# Patient Record
Sex: Female | Born: 1990 | Race: Black or African American | Hispanic: No | Smoking: Former smoker
Health system: Southern US, Community
[De-identification: ages and names within clinical notes are randomized; demographics above are authoritative.]

## PROBLEM LIST (undated history)

## (undated) DIAGNOSIS — A749 Chlamydial infection, unspecified: Secondary | ICD-10-CM

## (undated) HISTORY — DX: Chlamydial infection, unspecified: A74.9

---
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
## 2012-01-30 ENCOUNTER — Encounter: Payer: Self-pay | Admitting: Pediatric Cardiology

## 2012-02-15 ENCOUNTER — Emergency Department: Payer: Self-pay | Admitting: Emergency Medicine

## 2012-05-20 ENCOUNTER — Emergency Department: Payer: Self-pay | Admitting: Emergency Medicine

## 2012-05-20 LAB — URINALYSIS, COMPLETE
Bilirubin,UR: NEGATIVE
Blood: NEGATIVE
Glucose,UR: NEGATIVE mg/dL (ref 0–75)
Ph: 7 (ref 4.5–8.0)
Protein: NEGATIVE
RBC,UR: 4 /HPF (ref 0–5)
Squamous Epithelial: 15

## 2012-05-20 LAB — CBC
HCT: 31.5 % — ABNORMAL LOW (ref 35.0–47.0)
HGB: 10.4 g/dL — ABNORMAL LOW (ref 12.0–16.0)
MCH: 27.4 pg (ref 26.0–34.0)
MCV: 83 fL (ref 80–100)
Platelet: 305 10*3/uL (ref 150–440)
RBC: 3.81 10*6/uL (ref 3.80–5.20)
RDW: 18.8 % — ABNORMAL HIGH (ref 11.5–14.5)
WBC: 10.7 10*3/uL (ref 3.6–11.0)

## 2012-05-20 LAB — COMPREHENSIVE METABOLIC PANEL
Alkaline Phosphatase: 53 U/L (ref 50–136)
Calcium, Total: 8.9 mg/dL (ref 8.5–10.1)
Creatinine: 0.51 mg/dL — ABNORMAL LOW (ref 0.60–1.30)
EGFR (African American): >60
EGFR (Non-African Amer.): >60
Glucose: 84 mg/dL (ref 65–99)
Osmolality: 271 (ref 275–301)
Potassium: 4.2 mmol/L (ref 3.5–5.1)
SGOT(AST): 12 U/L — ABNORMAL LOW (ref 15–37)
SGPT (ALT): 13 U/L (ref 12–78)
Sodium: 137 mmol/L (ref 136–145)
Total Protein: 6.9 g/dL (ref 6.4–8.2)

## 2012-05-20 LAB — WET PREP, GENITAL

## 2012-05-20 LAB — LIPASE, BLOOD: Lipase: 95 U/L (ref 73–393)

## 2012-05-21 LAB — URINE CULTURE

## 2012-05-29 ENCOUNTER — Emergency Department: Payer: Self-pay | Admitting: Emergency Medicine

## 2012-07-16 ENCOUNTER — Emergency Department: Payer: Self-pay | Admitting: Emergency Medicine

## 2012-07-16 LAB — COMPREHENSIVE METABOLIC PANEL
Alkaline Phosphatase: 67 U/L (ref 50–136)
Anion Gap: 8 (ref 7–16)
Bilirubin,Total: 0.3 mg/dL (ref 0.2–1.0)
Calcium, Total: 8.2 mg/dL — ABNORMAL LOW (ref 8.5–10.1)
Co2: 23 mmol/L (ref 21–32)
Creatinine: 0.68 mg/dL (ref 0.60–1.30)
EGFR (African American): >60
Glucose: 102 mg/dL — ABNORMAL HIGH (ref 65–99)
Osmolality: 272 (ref 275–301)
SGOT(AST): 23 U/L (ref 15–37)
SGPT (ALT): 17 U/L (ref 12–78)
Sodium: 137 mmol/L (ref 136–145)

## 2012-07-16 LAB — URINALYSIS, COMPLETE
Bilirubin,UR: NEGATIVE
Glucose,UR: NEGATIVE mg/dL (ref 0–75)
Leukocyte Esterase: NEGATIVE
Ph: 6 (ref 4.5–8.0)
Protein: NEGATIVE
RBC,UR: NONE SEEN /HPF (ref 0–5)
Specific Gravity: 1.003 (ref 1.003–1.030)
Squamous Epithelial: 3

## 2012-07-16 LAB — CBC
HCT: 29.1 % — ABNORMAL LOW (ref 35.0–47.0)
HGB: 9.9 g/dL — ABNORMAL LOW (ref 12.0–16.0)
Platelet: 277 10*3/uL (ref 150–440)
RBC: 3.48 10*6/uL — ABNORMAL LOW (ref 3.80–5.20)
RDW: 13.9 % (ref 11.5–14.5)
WBC: 10.8 10*3/uL (ref 3.6–11.0)

## 2012-07-16 LAB — CK TOTAL AND CKMB (NOT AT ARMC): CK-MB: <0.5 ng/mL — ABNORMAL LOW (ref 0.5–3.6)

## 2012-07-16 LAB — TROPONIN I: Troponin-I: <0.02 ng/mL

## 2012-08-17 ENCOUNTER — Observation Stay: Payer: Self-pay | Admitting: Obstetrics and Gynecology

## 2012-08-17 ENCOUNTER — Emergency Department: Payer: Self-pay | Admitting: Emergency Medicine

## 2012-08-17 LAB — URINALYSIS, COMPLETE
Bilirubin,UR: NEGATIVE
Glucose,UR: NEGATIVE mg/dL (ref 0–75)
Ketone: NEGATIVE
Ph: 7 (ref 4.5–8.0)
Protein: NEGATIVE
RBC,UR: 1 /HPF (ref 0–5)
Specific Gravity: 1.014 (ref 1.003–1.030)
Squamous Epithelial: 11
WBC UR: 3 /HPF (ref 0–5)

## 2012-08-30 ENCOUNTER — Emergency Department: Payer: Self-pay | Admitting: Emergency Medicine

## 2012-08-31 LAB — URINALYSIS, COMPLETE
Bilirubin,UR: NEGATIVE
Blood: NEGATIVE
Hyaline Cast: 1
Nitrite: NEGATIVE
Protein: NEGATIVE
RBC,UR: NONE SEEN /HPF (ref 0–5)
Specific Gravity: 1.006 (ref 1.003–1.030)
Squamous Epithelial: 17
WBC UR: 4 /HPF (ref 0–5)

## 2012-12-11 ENCOUNTER — Inpatient Hospital Stay: Payer: Self-pay | Admitting: Obstetrics and Gynecology

## 2012-12-11 DIAGNOSIS — I10 Essential (primary) hypertension: Secondary | ICD-10-CM

## 2012-12-11 LAB — CBC WITH DIFFERENTIAL/PLATELET
Basophil #: 0.1 10*3/uL (ref 0.0–0.1)
Basophil %: 1 %
Eosinophil %: 0.8 %
HCT: 32.3 % — ABNORMAL LOW (ref 35.0–47.0)
HGB: 11.1 g/dL — ABNORMAL LOW (ref 12.0–16.0)
Lymphocyte #: 1.9 10*3/uL (ref 1.0–3.6)
Lymphocyte %: 23.6 %
MCHC: 34.3 g/dL (ref 32.0–36.0)
MCV: 85 fL (ref 80–100)
Monocyte %: 12.4 %
Neutrophil #: 5.1 10*3/uL (ref 1.4–6.5)
Neutrophil %: 62.2 %
Platelet: 210 10*3/uL (ref 150–440)
RBC: 3.82 10*6/uL (ref 3.80–5.20)
RDW: 14.1 % (ref 11.5–14.5)

## 2012-12-11 LAB — GC/CHLAMYDIA PROBE AMP

## 2012-12-11 LAB — PROTEIN / CREATININE RATIO, URINE
Creatinine, Urine: 22.9 mg/dL — ABNORMAL LOW (ref 30.0–125.0)
Protein, Random Urine: <5 mg/dL — ABNORMAL LOW (ref 0–12)

## 2013-12-04 ENCOUNTER — Emergency Department: Payer: Self-pay | Admitting: Internal Medicine

## 2014-07-10 NOTE — Op Note (Signed)
PATIENT NAME:  Shelly Hamilton, Shelly Hamilton MR#:  [id number] DATE OF BIRTH:  December 04, 1990  DATE OF PROCEDURE:  12/11/2012  PREOPERATIVE DIAGNOSES:  1. Term gestation.  2. Early labor.  3. Clinical diagnosis of cephalopelvic disproportion.   POSTOPERATIVE DIAGNOSES:  1. Term gestation.  2. Early labor.  3. Clinical diagnosis of cephalopelvic disproportion.   PROCEDURE: Primary low transverse cesarean section.   SURGEON: Ricky L. Logan BoresEvans, MD  ASSISTANT: Cydney OkAngela R. Lugiano, CNM  ANESTHESIA: Spinal.   FINDINGS: Small narrow pelvis, grossly normal uterus, tubes. Vigorous female infant weighing 10 pounds 3 ounces or 4620 grams. Excellent hemostasis and cosmesis.   ESTIMATED BLOOD LOSS: 650.   COMPLICATIONS: None.   SPECIMENS: None.   DRAINS: Foley.   PROCEDURE IN DETAIL: The patient was consented, after discussing with her the clinical diagnosis of CPD. 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 Also, recently diagnosed with Chlamydia, and has not, as of yet, had a test of cure. Recommended cesarean section. She stated understanding and desire to proceed. Consent was signed. The patient was taken to the operating room and placed in the seated position, where spinal was placed. She was placed in the supine position, prepped and draped in the usual sterile fashion. Foley was inserted, and after assuring adequate anesthesia, a #10 blade was used to create a Pfannenstiel incision, and primary low transverse cesarean section was carried out in the usual fashion with the exception of a small mid T of incision and use of vacuum extractor to deliver the vertex.   Chromic interlocking on the uterus, 0 Vicryl running on the fascia, cautery on subcutaneous and clips used on the skin.   The patient tolerated the procedure well. All instrument, needle and sponge counts were correct. I anticipate a routine  postoperative course. Ancef 1 gram  was given IV preoperatively.   ____________________________ Reatha Harpsicky L. Logan BoresEvans, MD rle:OSi D: 12/11/2012 12:34:45 ET T: 12/11/2012 13:20:14 ET JOB#: [id number]  cc: Ricky L. Logan BoresEvans, MD, <Dictator> Augustina MoodICK L Rebecah Dangerfield MD ELECTRONICALLY SIGNED 12/16/2012 10:23

## 2014-07-28 NOTE — H&P (Signed)
L&D Evaluation:  History:  HPI 24 y/o G1 @ 40/3 EDC 12/08/12 arrives with c/o regular contractions, denies leaking fluid or vaginal bleeding, baby is active. 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 B/P elevated, denies headache/visual disturbances, NV epigastric or RUQ pain + Sickle cell trait.   Presents with contractions   Patient's Medical History see above   Patient's Surgical History none   Medications Pre Natal Vitamins  Valtrex 1000mg  qd   Allergies NKDA   Social History none   Family History Non-Contributory   ROS:  ROS All systems were reviewed.  HEENT, CNS, GI, GU, Respiratory, CV, Renal and Musculoskeletal systems were found to be normal.   Exam:  Vital Signs stable  elevated B/P  149/97  131/3  131/78   Urine Protein to lab PC ratio   General no apparent distress   Mental Status clear   Chest clear   Heart normal sinus rhythm   Abdomen gravid, non-tender   Estimated Fetal Weight Average for gestational age   Fetal Position vtx   Fundal Height term   Back no CVAT   Edema 3+  pedal tibial   Reflexes 2+   Clonus negative   Pelvic Recent HSV lesion left labia visually healed (remains slightly tender per pt) -no other lesion visualized. 2cm cx posterior 50% vtx ballotable no bloody show BOWI   Mebranes Intact   FHT normal rate with no decels, baseline 120's 130's accels 130's 140's avg variability   Fetal Heart Rate 136   Ucx irregular, Q 2-5 mins 60 sec moderate   Skin dry   Lymph no lymphadenopathy   Impression:  Impression early labor, evaluation for PIH, Recent HSV II outbreak  Mild Pre eclampsia   Plan:  Plan EFM/NST, PIH panel   Comments Dr Logan BoresEvans consulted considering Primigravid status with recent HSV II outbreak-(uncertain primary or secondary) and mild Preeclampsia  (await labs) will recommend to proceed with primary csection. Explained to pt, questions answered, what to expect discussed, consent obtained.   Electronic Signatures: Albertina ParrLugiano, Pride Gonzales B (CNM)  (Signed 24-Sep-14 10:34)  Authored: L&D Evaluation   Last Updated: 24-Sep-14 10:34 by Albertina ParrLugiano, Iain Sawchuk B (CNM)

## 2014-11-28 ENCOUNTER — Emergency Department
Admission: EM | Admit: 2014-11-28 | Discharge: 2014-11-28 | Disposition: A | Discharge status: Home / Self Care | Payer: Self-pay | Attending: Emergency Medicine | Admitting: Emergency Medicine

## 2014-11-28 ENCOUNTER — Encounter: Payer: Self-pay | Admitting: Emergency Medicine

## 2014-11-28 DIAGNOSIS — M25562 Pain in left knee: Secondary | ICD-10-CM | POA: Insufficient documentation

## 2014-11-28 DIAGNOSIS — Z720 Tobacco use: Secondary | ICD-10-CM | POA: Insufficient documentation

## 2014-11-28 DIAGNOSIS — Z3202 Encounter for pregnancy test, result negative: Secondary | ICD-10-CM | POA: Insufficient documentation

## 2014-11-28 LAB — POCT PREGNANCY, URINE: Preg Test, Ur: NEGATIVE

## 2014-11-28 MED ORDER — NAPROXEN 500 MG PO TABS: 500.0000 mg | ORAL_TABLET | Freq: Two times a day (BID) | ORAL | Status: DC

## 2014-11-28 NOTE — ED Provider Notes (Signed)
[facility] Emergency Department Provider Note  ____________________________________________  Time seen: Approximately 11:37 AM  I have reviewed the triage vital signs and the nursing notes.   HISTORY  Chief Complaint Knee Pain  HPI Shelly Hamilton is a 24 y.o. female complaint left knee pain for "several days". 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 She has not taken any over-the-counter medication for it. She denies any trauma to her knee both recently and in the past. She has not seen any swelling during the times that she's had pain. She denies any health problems. She states her pain is 8 out of 10 at present.   History reviewed. No pertinent past medical history.  There are no active problems to display for this patient.   History reviewed. No pertinent past surgical history.  Current Outpatient Rx  Name  Route  Sig  Dispense  Refill  . naproxen (NAPROSYN) 500 MG tablet   Oral   Take 1 tablet (500 mg total) by mouth 2 (two) times daily with a meal.   30 tablet   0     Allergies Review of patient's allergies indicates no known allergies.  History reviewed. No pertinent family history.  Social History Social History  Substance Use Topics  . Smoking status: Current Every Day Smoker  . Smokeless tobacco: None  . Alcohol Use: Yes    Review of Systems Constitutional: No fever/chills ENT: No sore throat. Cardiovascular: Denies chest pain. Respiratory: Denies shortness of breath. Gastrointestinal: No abdominal pain.  No nausea, no vomiting.  Genitourinary: Negative for dysuria. No birth control used Musculoskeletal: Negative for back pain. Skin: Negative for rash. Neurological: Negative for headaches, focal weakness or numbness.  10-point ROS otherwise negative.  ____________________________________________   PHYSICAL EXAM:  VITAL SIGNS: ED Triage Vitals  Enc Vitals Group     BP 11/28/14  1050 122/73 mmHg     Pulse Rate 11/28/14 1050 94     Resp 11/28/14 1050 20     Temp 11/28/14 1050 98.5 F (36.9 C)     Temp Source 11/28/14 1050 Oral     SpO2 11/28/14 1050 98 %     Weight 11/28/14 1050 120 lb (54.432 kg)     Height 11/28/14 1050  (1.549 m)     Head Cir --      Peak Flow --      Pain Score 11/28/14 1051 8     Pain Loc --      Pain Edu? --      Excl. in GC? --     Constitutional: Alert and oriented. Well appearing and in no acute distress. Eyes: Conjunctivae are normal. PERRL. EOMI. Head: Atraumatic. Nose: No congestion/rhinnorhea. Neck: No stridor.  Cardiovascular: Normal rate, regular rhythm. Grossly normal heart sounds.  Good peripheral circulation. Respiratory: Normal respiratory effort.  No retractions. Lungs CTAB. Gastrointestinal: Soft and nontender. No distention. Musculoskeletal: Left knee with minimal tenderness anteriorly. There is no deformity or swelling or effusion noted. Range of motion is without crepitus. Ligaments are stable bilaterally. No lower extremity tenderness nor edema.  No joint effusions. Neurologic:  Normal speech and language. No gross focal neurologic deficits are appreciated. No gait instability. Skin:  Skin is warm, dry and intact. No rash noted. Left knee no erythema, abrasion, or ecchymosis. Psychiatric: Mood and affect are normal. Speech and behavior are normal.  ____________________________________________   LABS (all labs ordered are listed, but only abnormal  results are displayed)  Labs Reviewed  POC URINE PREG, ED    PROCEDURES  Procedure(s) performed: None  Critical Care performed: No  ____________________________________________   INITIAL IMPRESSION / ASSESSMENT AND PLAN / ED COURSE  Pertinent labs & imaging results that were available during my care of the patient were reviewed by me and considered in my medical decision making (see chart for details).  Patient was placed in knee immobilizer and also  given a prescription for naproxen to take twice a day. Patient is also to follow-up with Dr. Martha Clan if any continued knee problems. She is instructed to use ice and elevate while at home today. Also pregnancy test in the emergency room today was negative. ____________________________________________   FINAL CLINICAL IMPRESSION(S) / ED DIAGNOSES  Final diagnoses:  Knee pain, left anterior      Tommi Rumps, PA-C 11/28/14 1217  Minna Antis, MD 11/28/14 1513

## 2014-11-28 NOTE — ED Notes (Signed)
Pt called - she is unable to come at this moment to pick up paperwork, but will come later. Informed her i will keep the paperwork in flex and when she gets here to call and i will bring out her papers.

## 2014-11-28 NOTE — ED Notes (Signed)
Pt to ed with c/o left knee pain.  Denies known injury.  No swelling noted.  No bruising noted.

## 2014-11-28 NOTE — ED Notes (Signed)
Realized pt had the wrong paperwork and tried to catch her but she had left. Attempted to call pt but the vm states cant leave msg (mailbox not set up). Safety zone reported.

## 2015-05-23 ENCOUNTER — Encounter: Payer: Self-pay | Admitting: Emergency Medicine

## 2015-05-23 ENCOUNTER — Emergency Department
Admission: EM | Admit: 2015-05-23 | Discharge: 2015-05-23 | Discharge status: Against Medical Advice | Payer: Self-pay | Attending: Emergency Medicine | Admitting: Emergency Medicine

## 2015-05-23 DIAGNOSIS — Z3490 Encounter for supervision of normal pregnancy, unspecified, unspecified trimester: Secondary | ICD-10-CM

## 2015-05-23 DIAGNOSIS — R112 Nausea with vomiting, unspecified: Secondary | ICD-10-CM | POA: Insufficient documentation

## 2015-05-23 DIAGNOSIS — Z791 Long term (current) use of non-steroidal anti-inflammatories (NSAID): Secondary | ICD-10-CM | POA: Insufficient documentation

## 2015-05-23 DIAGNOSIS — Z331 Pregnant state, incidental: Secondary | ICD-10-CM | POA: Insufficient documentation

## 2015-05-23 DIAGNOSIS — Z87891 Personal history of nicotine dependence: Secondary | ICD-10-CM | POA: Insufficient documentation

## 2015-05-23 DIAGNOSIS — R1030 Lower abdominal pain, unspecified: Secondary | ICD-10-CM | POA: Insufficient documentation

## 2015-05-23 DIAGNOSIS — R102 Pelvic and perineal pain: Secondary | ICD-10-CM | POA: Insufficient documentation

## 2015-05-23 LAB — COMPREHENSIVE METABOLIC PANEL
ALBUMIN: 4.1 g/dL (ref 3.5–5.0)
ALT: 12 U/L — ABNORMAL LOW (ref 14–54)
ANION GAP: 6 (ref 5–15)
AST: 16 U/L (ref 15–41)
Alkaline Phosphatase: 38 U/L (ref 38–126)
BUN: 5 mg/dL — ABNORMAL LOW (ref 6–20)
CHLORIDE: 107 mmol/L (ref 101–111)
CO2: 24 mmol/L (ref 22–32)
Calcium: 8.8 mg/dL — ABNORMAL LOW (ref 8.9–10.3)
Creatinine, Ser: 0.65 mg/dL (ref 0.44–1.00)
GFR calc Af Amer: >60 mL/min (ref >60)
GFR calc non Af Amer: >60 mL/min (ref >60)
GLUCOSE: 89 mg/dL (ref 65–99)
POTASSIUM: 3.5 mmol/L (ref 3.5–5.1)
SODIUM: 137 mmol/L (ref 135–145)
Total Bilirubin: 0.7 mg/dL (ref 0.3–1.2)
Total Protein: 6.7 g/dL (ref 6.5–8.1)

## 2015-05-23 LAB — URINALYSIS COMPLETE WITH MICROSCOPIC (ARMC ONLY)
BACTERIA UA: NONE SEEN
BILIRUBIN URINE: NEGATIVE
GLUCOSE, UA: NEGATIVE mg/dL
HGB URINE DIPSTICK: NEGATIVE
Ketones, ur: NEGATIVE mg/dL
LEUKOCYTES UA: NEGATIVE
Nitrite: NEGATIVE
PH: 6 (ref 5.0–8.0)
Protein, ur: NEGATIVE mg/dL
Specific Gravity, Urine: 1.014 (ref 1.005–1.030)

## 2015-05-23 LAB — CBC
HEMATOCRIT: 31.8 % — AB (ref 35.0–47.0)
HEMOGLOBIN: 10.4 g/dL — AB (ref 12.0–16.0)
MCH: 26.6 pg (ref 26.0–34.0)
MCHC: 32.7 g/dL (ref 32.0–36.0)
MCV: 81.4 fL (ref 80.0–100.0)
Platelets: 353 10*3/uL (ref 150–440)
RBC: 3.9 MIL/uL (ref 3.80–5.20)
RDW: 16.6 % — ABNORMAL HIGH (ref 11.5–14.5)
WBC: 8.8 10*3/uL (ref 3.6–11.0)

## 2015-05-23 LAB — POCT PREGNANCY, URINE: PREG TEST UR: POSITIVE — AB

## 2015-05-23 LAB — HCG, QUANTITATIVE, PREGNANCY: HCG, BETA CHAIN, QUANT, S: 35102 m[IU]/mL — AB (ref <5)

## 2015-05-23 LAB — LIPASE, BLOOD: Lipase: 13 U/L (ref 11–51)

## 2015-05-23 NOTE — ED Notes (Signed)
Pt came to front desk upset about wait time and other people brought back before her.  Explained acuity and why some patients may have been brought back as well as there are different areas to the ED and different waiting rooms people may have been brought too.  Informed her unable to give her a wait time.

## 2015-05-23 NOTE — ED Notes (Signed)
Pt states that she has been having "severe abdominal pain for a couple of days". Pt states she is unable to keep food down and "barely can hold water down". Pt also c/o red streaks of blood in her vomit, denies coffee ground emesis at this time. Pt presents to triage in NAD. Skin warm, dry and intact, respirations even and unlabored at this time.

## 2015-05-23 NOTE — ED Provider Notes (Signed)
[facility] Emergency Department Provider Note    ____________________________________________  Time seen: ~1500  I have reviewed the triage vital signs and the nursing notes.   HISTORY  Chief Complaint Abdominal Pain and Hematemesis   History limited by: Not Limited   HPI Shelly Hamilton is a [age] y.o. female who presents to the emergency department today because of concerns for severe abdominal pain. She states it is located in the lower abdomen. It is on both sides. It is severe. It has been fairly constant over the past couple days. In addition the patient has had associated nausea and vomiting. She states that she has been able to keep much down. She states her last menstrual period was in January.Denies any vaginal bleeding.   History reviewed. No pertinent past medical history.  There are no active problems to display for this patient.   Past Surgical History  Procedure Laterality Date  . Cesarean section      Current Outpatient Rx  Name  Route  Sig  Dispense  Refill  . naproxen (NAPROSYN) 500 MG tablet   Oral   Take 1 tablet (500 mg total) by mouth 2 (two) times daily with a meal.   30 tablet   0     Allergies Review of patient's allergies indicates no known allergies.  History reviewed. No pertinent family history.  Social History Social History  Substance Use Topics  . Smoking status: Former Smoker    Quit date: 05/19/2015  . Smokeless tobacco: None  . Alcohol Use: Yes    Review of Systems  Constitutional: Negative for fever. Cardiovascular: Negative for chest pain. Respiratory: Negative for shortness of breath. Gastrointestinal: Positive for pelvic pain Neurological: Negative for headaches, focal weakness or numbness.   10-point ROS otherwise negative.  ____________________________________________   PHYSICAL EXAM:  VITAL SIGNS: ED Triage Vitals  Enc Vitals Group     BP 05/23/15 1210 118/76 mmHg     Pulse Rate  05/23/15 1210 58     Resp 05/23/15 1210 18     Temp 05/23/15 1210 98 F (36.7 C)     Temp Source 05/23/15 1210 Oral     SpO2 05/23/15 1210 100 %     Weight 05/23/15 1210 120 lb (54.432 kg)     Height 05/23/15 1210 5\' 3"  (1.6 m)     Head Cir --      Peak Flow --      Pain Score 05/23/15 1210 9   Constitutional: Alert and oriented. Well appearing and in no distress. Somewhat frustrated. Eyes: Conjunctivae are normal. PERRL. Normal extraocular movements. ENT   Head: Normocephalic and atraumatic.   Nose: No congestion/rhinnorhea.   Mouth/Throat: Mucous membranes are moist.   Neck: No stridor. Hematological/Lymphatic/Immunilogical: No cervical lymphadenopathy. Cardiovascular: Normal rate, regular rhythm.  No murmurs, rubs, or gallops. Respiratory: Normal respiratory effort without tachypnea nor retractions. Breath sounds are clear and equal bilaterally. No wheezes/rales/rhonchi. Gastrointestinal: Soft and minimally tender in the suprapubic region. No rebound. No guarding. Genitourinary: Deferred Musculoskeletal: Normal range of motion in all extremities. No joint effusions.  No lower extremity tenderness nor edema. Neurologic:  Normal speech and language. No gross focal neurologic deficits are appreciated.  Skin:  Skin is warm, dry and intact. No rash noted. Psychiatric: Mood and affect are normal. Speech and behavior are normal. Patient exhibits appropriate insight and judgment.  ____________________________________________    LABS (pertinent positives/negatives)  Labs Reviewed  COMPREHENSIVE METABOLIC PANEL - Abnormal; Notable for the following:  BUN 5 (*)    Calcium 8.8 (*)    ALT 12 (*)    All other components within normal limits  CBC - Abnormal; Notable for the following:    Hemoglobin 10.4 (*)    HCT 31.8 (*)    RDW 16.6 (*)    All other components within normal limits  URINALYSIS COMPLETEWITH MICROSCOPIC ([facility] ONLY) - Abnormal; Notable for the following:     Color, Urine YELLOW (*)    APPearance CLEAR (*)    Squamous Epithelial / LPF 0-5 (*)    All other components within normal limits  HCG, QUANTITATIVE, PREGNANCY - Abnormal; Notable for the following:    hCG, Beta Chain, Quant, S 35102 (*)    All other components within normal limits  POCT PREGNANCY, URINE - Abnormal; Notable for the following:    Preg Test, Ur POSITIVE (*)    All other components within normal limits  LIPASE, BLOOD  POC URINE PREG, ED     ____________________________________________   EKG  None  ____________________________________________    RADIOLOGY  None   ____________________________________________   PROCEDURES  Procedure(s) performed: None  Critical Care performed: No  ____________________________________________   INITIAL IMPRESSION / ASSESSMENT AND PLAN / ED COURSE  Pertinent labs & imaging results that were available during my care of the patient were reviewed by me and considered in my medical decision making (see chart for details).  Patient presented to the emergency department today because of concerns for pelvic pain nausea and vomiting. Patient's urine pregnancy was positive. Patient denies knowledge of pregnancy. By the time I was able to examine the patient she was quite frustrated with the wait times. She stated she had to leave to go to her job. I discussed with the patient my concern for possible ectopic pregnancy. I did discuss her desire to obtain an ultrasound. We did discuss the possibility of a ruptured ectopic leading to severe bleeding and possibly death. Patient verbalized understanding of these wrist. Patient did choose to leave the emergency department prior to ultrasound being performed. I did encourage patient to follow up with her OB/GYN doctors.   ____________________________________________   FINAL CLINICAL IMPRESSION(S) / ED DIAGNOSES  Final diagnoses:  Pregnancy     Phineas Semen, MD 05/23/15  [phone]

## 2015-05-23 NOTE — Discharge Instructions (Signed)
Please seek medical attention for any high fevers, chest pain, shortness of breath, change in behavior, persistent vomiting, bloody stool or any other new or concerning symptoms.   First Trimester of Pregnancy The first trimester of pregnancy is from week 1 until the end of week 12 (months 1 through 3). A week after a sperm fertilizes an egg, the egg will implant on the wall of the uterus. This embryo will begin to develop into a baby. Genes from you and your partner are forming the baby. The female genes determine whether the baby is a boy or a girl. At 6-8 weeks, the eyes and face are formed, and the heartbeat can be seen on ultrasound. At the end of 12 weeks, all the baby's organs are formed.  Now that you are pregnant, you will want to do everything you can to have a healthy baby. Two of the most important things are to get good prenatal care and to follow your health care provider's instructions. Prenatal care is all the medical care you receive before the baby's birth. This care will help prevent, find, and treat any problems during the pregnancy and childbirth. BODY CHANGES Your body goes through many changes during pregnancy. The changes vary from woman to woman.   You may gain or lose a couple of pounds at first.  You may feel sick to your stomach (nauseous) and throw up (vomit). If the vomiting is uncontrollable, call your health care provider.  You may tire easily.  You may develop headaches that can be relieved by medicines approved by your health care provider.  You may urinate more often. Painful urination may mean you have a bladder infection.  You may develop heartburn as a result of your pregnancy.  You may develop constipation because certain hormones are causing the muscles that push waste through your intestines to slow down.  You may develop hemorrhoids or swollen, bulging veins (varicose veins).  Your breasts may begin to grow larger and become tender. Your nipples may  stick out more, and the tissue that surrounds them (areola) may become darker.  Your gums may bleed and may be sensitive to brushing and flossing.  Dark spots or blotches (chloasma, mask of pregnancy) may develop on your face. This will likely fade after the baby is born.  Your menstrual periods will stop.  You may have a loss of appetite.  You may develop cravings for certain kinds of food.  You may have changes in your emotions from day to day, such as being excited to be pregnant or being concerned that something may go wrong with the pregnancy and baby.  You may have more vivid and strange dreams.  You may have changes in your hair. These can include thickening of your hair, rapid growth, and changes in texture. Some women also have hair loss during or after pregnancy, or hair that feels dry or thin. Your hair will most likely return to normal after your baby is born. WHAT TO EXPECT AT YOUR PRENATAL VISITS During a routine prenatal visit:  You will be weighed to make sure you and the baby are growing normally.  Your blood pressure will be taken.  Your abdomen will be measured to track your baby's growth.  The fetal heartbeat will be listened to starting around week 10 or 12 of your pregnancy.  Test results from any previous visits will be discussed. Your health care provider may ask you:  How you are feeling.  If you are feeling  the baby move.  If you have had any abnormal symptoms, such as leaking fluid, bleeding, severe headaches, or abdominal cramping.  If you are using any tobacco products, including cigarettes, chewing tobacco, and electronic cigarettes.  If you have any questions. Other tests that may be performed during your first trimester include:  Blood tests to find your blood type and to check for the presence of any previous infections. They will also be used to check for low iron levels (anemia) and Rh antibodies. Later in the pregnancy, blood tests for  diabetes will be done along with other tests if problems develop.  Urine tests to check for infections, diabetes, or protein in the urine.  An ultrasound to confirm the proper growth and development of the baby.  An amniocentesis to check for possible genetic problems.  Fetal screens for spina bifida and Down syndrome.  You may need other tests to make sure you and the baby are doing well.  HIV (human immunodeficiency virus) testing. Routine prenatal testing includes screening for HIV, unless you choose not to have this test. HOME CARE INSTRUCTIONS  Medicines  Follow your health care provider's instructions regarding medicine use. Specific medicines may be either safe or unsafe to take during pregnancy.  Take your prenatal vitamins as directed.  If you develop constipation, try taking a stool softener if your health care provider approves. Diet  Eat regular, well-balanced meals. Choose a variety of foods, such as meat or vegetable-based protein, fish, milk and low-fat dairy products, vegetables, fruits, and whole grain breads and cereals. Your health care provider will help you determine the amount of weight gain that is right for you.  Avoid raw meat and uncooked cheese. These carry germs that can cause birth defects in the baby.  Eating four or five small meals rather than three large meals a day may help relieve nausea and vomiting. If you start to feel nauseous, eating a few soda crackers can be helpful. Drinking liquids between meals instead of during meals also seems to help nausea and vomiting.  If you develop constipation, eat more high-fiber foods, such as fresh vegetables or fruit and whole grains. Drink enough fluids to keep your urine clear or pale yellow. Activity and Exercise  Exercise only as directed by your health care provider. Exercising will help you:  Control your weight.  Stay in shape.  Be prepared for labor and delivery.  Experiencing pain or cramping  in the lower abdomen or low back is a good sign that you should stop exercising. Check with your health care provider before continuing normal exercises.  Try to avoid standing for long periods of time. Move your legs often if you must stand in one place for a long time.  Avoid heavy lifting.  Wear low-heeled shoes, and practice good posture.  You may continue to have sex unless your health care provider directs you otherwise. Relief of Pain or Discomfort  Wear a good support bra for breast tenderness.   Take warm sitz baths to soothe any pain or discomfort caused by hemorrhoids. Use hemorrhoid cream if your health care provider approves.   Rest with your legs elevated if you have leg cramps or low back pain.  If you develop varicose veins in your legs, wear support hose. Elevate your feet for 15 minutes, 3-4 times a day. Limit salt in your diet. Prenatal Care  Schedule your prenatal visits by the twelfth week of pregnancy. They are usually scheduled monthly at first, then more  often in the last 2 months before delivery.  Write down your questions. Take them to your prenatal visits.  Keep all your prenatal visits as directed by your health care provider. Safety  Wear your seat belt at all times when driving.  Make a list of emergency phone numbers, including numbers for family, friends, the hospital, and police and fire departments. General Tips  Ask your health care provider for a referral to a local prenatal education class. Begin classes no later than at the beginning of month 6 of your pregnancy.  Ask for help if you have counseling or nutritional needs during pregnancy. Your health care provider can offer advice or refer you to specialists for help with various needs.  Do not use hot tubs, steam rooms, or saunas.  Do not douche or use tampons or scented sanitary pads.  Do not cross your legs for long periods of time.  Avoid cat litter boxes and soil used by cats.  These carry germs that can cause birth defects in the baby and possibly loss of the fetus by miscarriage or stillbirth.  Avoid all smoking, herbs, alcohol, and medicines not prescribed by your health care provider. Chemicals in these affect the formation and growth of the baby.  Do not use any tobacco products, including cigarettes, chewing tobacco, and electronic cigarettes. If you need help quitting, ask your health care provider. You may receive counseling support and other resources to help you quit.  Schedule a dentist appointment. At home, brush your teeth with a soft toothbrush and be gentle when you floss. SEEK MEDICAL CARE IF:   You have dizziness.  You have mild pelvic cramps, pelvic pressure, or nagging pain in the abdominal area.  You have persistent nausea, vomiting, or diarrhea.  You have a bad smelling vaginal discharge.  You have pain with urination.  You notice increased swelling in your face, hands, legs, or ankles. SEEK IMMEDIATE MEDICAL CARE IF:   You have a fever.  You are leaking fluid from your vagina.  You have spotting or bleeding from your vagina.  You have severe abdominal cramping or pain.  You have rapid weight gain or loss.  You vomit blood or material that looks like coffee grounds.  You are exposed to [place] measles and have never had them.  You are exposed to fifth disease or chickenpox.  You develop a severe headache.  You have shortness of breath.  You have any kind of trauma, such as from a fall or a car accident.   This information is not intended to replace advice given to you by your health care provider. Make sure you discuss any questions you have with your health care provider.   Document Released: 02/28/2001 Document Revised: 03/27/2014 Document Reviewed: 01/14/2013 Elsevier Interactive Patient Education [employer]2016 [employer] Inc.

## 2015-05-23 NOTE — ED Notes (Signed)
Patient states she has waited long hours in lobby and is unable to stay d/t needing to go to her second job. Advised patient of her lab results (patient was not aware she was pregnant). Patient advised by MD of risks for leaving. States she is unable to stay. Advised by this RN to seek medical attention if pain worsens, or any abnormal symptoms progress/occur. Patient agreed. [facility] and is aware she needs ultrasound. Patient given graham crackers, peanut butter, and lemon lime soda d/t feeling weak and not eating today before leaving.

## 2015-05-27 ENCOUNTER — Encounter: Payer: Self-pay | Admitting: *Deleted

## 2015-05-27 DIAGNOSIS — Y92009 Unspecified place in unspecified non-institutional (private) residence as the place of occurrence of the external cause: Secondary | ICD-10-CM | POA: Insufficient documentation

## 2015-05-27 DIAGNOSIS — O9A219 Injury, poisoning and certain other consequences of external causes complicating pregnancy, unspecified trimester: Secondary | ICD-10-CM | POA: Insufficient documentation

## 2015-05-27 DIAGNOSIS — F17200 Nicotine dependence, unspecified, uncomplicated: Secondary | ICD-10-CM | POA: Insufficient documentation

## 2015-05-27 DIAGNOSIS — Y998 Other external cause status: Secondary | ICD-10-CM | POA: Insufficient documentation

## 2015-05-27 DIAGNOSIS — Z3A00 Weeks of gestation of pregnancy not specified: Secondary | ICD-10-CM | POA: Insufficient documentation

## 2015-05-27 DIAGNOSIS — S29001A Unspecified injury of muscle and tendon of front wall of thorax, initial encounter: Secondary | ICD-10-CM | POA: Insufficient documentation

## 2015-05-27 DIAGNOSIS — O99330 Smoking (tobacco) complicating pregnancy, unspecified trimester: Secondary | ICD-10-CM | POA: Insufficient documentation

## 2015-05-27 DIAGNOSIS — S0993XA Unspecified injury of face, initial encounter: Secondary | ICD-10-CM | POA: Insufficient documentation

## 2015-05-27 DIAGNOSIS — S9032XA Contusion of left foot, initial encounter: Secondary | ICD-10-CM | POA: Insufficient documentation

## 2015-05-27 DIAGNOSIS — Z791 Long term (current) use of non-steroidal anti-inflammatories (NSAID): Secondary | ICD-10-CM | POA: Insufficient documentation

## 2015-05-27 DIAGNOSIS — Y9389 Activity, other specified: Secondary | ICD-10-CM | POA: Insufficient documentation
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
## 2015-05-28 ENCOUNTER — Emergency Department
Admission: EM | Admit: 2015-05-28 | Discharge: 2015-05-28 | Disposition: A | Discharge status: Home / Self Care | Payer: Self-pay | Attending: Emergency Medicine | Admitting: Emergency Medicine

## 2015-05-28 ENCOUNTER — Encounter: Payer: Self-pay | Admitting: Emergency Medicine

## 2015-05-28 ENCOUNTER — Emergency Department: Payer: Self-pay

## 2015-05-28 DIAGNOSIS — S9032XA Contusion of left foot, initial encounter: Secondary | ICD-10-CM

## 2015-05-28 DIAGNOSIS — T07XXXA Unspecified multiple injuries, initial encounter: Secondary | ICD-10-CM

## 2015-05-28 NOTE — ED Notes (Signed)
[facility] advocate at bedside at this time, pt given ginger ale to drink at this time, pt tolerating well, NAD noted.
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
## 2015-05-28 NOTE — ED Notes (Signed)
MD Forbach at bedside. 

## 2015-05-28 NOTE — ED Notes (Signed)
Pt given phone at this time to call a friend in order to stay there for the night. Pt stable, NAD noted.
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
## 2015-12-07 ENCOUNTER — Emergency Department: Payer: Self-pay

## 2015-12-07 ENCOUNTER — Emergency Department
Admission: EM | Admit: 2015-12-07 | Discharge: 2015-12-07 | Disposition: A | Discharge status: Home / Self Care | Payer: Self-pay | Attending: Emergency Medicine | Admitting: Emergency Medicine

## 2015-12-07 DIAGNOSIS — H5712 Ocular pain, left eye: Secondary | ICD-10-CM

## 2015-12-07 DIAGNOSIS — Y999 Unspecified external cause status: Secondary | ICD-10-CM | POA: Insufficient documentation

## 2015-12-07 DIAGNOSIS — W500XXA Accidental hit or strike by another person, initial encounter: Secondary | ICD-10-CM | POA: Insufficient documentation

## 2015-12-07 DIAGNOSIS — Y929 Unspecified place or not applicable: Secondary | ICD-10-CM | POA: Insufficient documentation

## 2015-12-07 DIAGNOSIS — Y9389 Activity, other specified: Secondary | ICD-10-CM | POA: Insufficient documentation

## 2015-12-07 DIAGNOSIS — S0512XA Contusion of eyeball and orbital tissues, left eye, initial encounter: Secondary | ICD-10-CM | POA: Insufficient documentation

## 2015-12-07 DIAGNOSIS — F17200 Nicotine dependence, unspecified, uncomplicated: Secondary | ICD-10-CM | POA: Insufficient documentation

## 2015-12-07 DIAGNOSIS — F1290 Cannabis use, unspecified, uncomplicated: Secondary | ICD-10-CM | POA: Insufficient documentation

## 2015-12-07 MED ORDER — NAPROXEN 500 MG PO TABS: 500.0000 mg | ORAL_TABLET | Freq: Two times a day (BID) | ORAL | 0 refills | Status: DC
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
## 2015-12-07 NOTE — Discharge Instructions (Signed)
Continue with ice as needed for swelling.
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
## 2015-12-15 ENCOUNTER — Telehealth: Payer: Self-pay | Admitting: Emergency Medicine

## 2015-12-15 NOTE — Telephone Encounter (Signed)
Patient had left message yesterday, so I called her back.  She said she needs to talk to Dr. Alphonzo LemmingsMcshane, and I explained that is not possible and asked what she needed. She says she needs a release to go back to work sent to her employer. I suggested that she see her pcp to release her to go back to work, as she said she did not follow up with the eye specialist.  She says she has no insurance, so she doesn't have a pcp.  I quickly tried to give her the resources available for low price or free health care in [place].  I explained that since it is not workers comp that we could not give anything to her employer.   I tried to explain that she needs to talk to medical records to get a copy for her employer. She said she had already done that.  She was becoming angry and saying that we don't know how to answer the phone, and then took my name. She then called me a bitch and hung up.

## 2017-03-14 ENCOUNTER — Emergency Department
Admission: EM | Admit: 2017-03-14 | Discharge: 2017-03-14 | Disposition: A | Discharge status: Home / Self Care | Payer: Self-pay | Attending: Emergency Medicine | Admitting: Emergency Medicine

## 2017-03-14 ENCOUNTER — Other Ambulatory Visit: Payer: Self-pay

## 2017-03-14 DIAGNOSIS — Z79899 Other long term (current) drug therapy: Secondary | ICD-10-CM | POA: Insufficient documentation

## 2017-03-14 DIAGNOSIS — F17210 Nicotine dependence, cigarettes, uncomplicated: Secondary | ICD-10-CM | POA: Insufficient documentation

## 2017-03-14 DIAGNOSIS — Y990 Civilian activity done for income or pay: Secondary | ICD-10-CM | POA: Insufficient documentation

## 2017-03-14 DIAGNOSIS — Y939 Activity, unspecified: Secondary | ICD-10-CM | POA: Insufficient documentation

## 2017-03-14 DIAGNOSIS — Y929 Unspecified place or not applicable: Secondary | ICD-10-CM | POA: Insufficient documentation

## 2017-03-14 DIAGNOSIS — W228XXA Striking against or struck by other objects, initial encounter: Secondary | ICD-10-CM | POA: Insufficient documentation

## 2017-03-14 DIAGNOSIS — S0003XA Contusion of scalp, initial encounter: Secondary | ICD-10-CM | POA: Insufficient documentation

## 2017-03-14 MED ORDER — ACETAMINOPHEN 325 MG PO TABS
650.0000 mg | ORAL_TABLET | Freq: Once | ORAL | Status: AC
  Administered 2017-03-14: 650 mg via ORAL
  Filled 2017-03-14: qty 2

## 2017-03-14 NOTE — Discharge Instructions (Signed)
Advised Tylenol or ibuprofen for headache.
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
## 2017-03-14 NOTE — ED Notes (Signed)
Pt discharged home after verbalizing understanding of discharge instructions; nad noted. 

## 2017-03-14 NOTE — ED Notes (Signed)
Pt indicated to this nurse that she would be filing WC for this visit; profile for [employer] accessed on website; Okey RegalCarol from their office at [phone] stated that UDS required. Pt refused and stated that [facility] and would take up with her employer.

## 2017-11-26 ENCOUNTER — Other Ambulatory Visit: Payer: Self-pay

## 2017-11-26 ENCOUNTER — Emergency Department
Admission: EM | Admit: 2017-11-26 | Discharge: 2017-11-26 | Disposition: A | Discharge status: Home / Self Care | Payer: Self-pay | Attending: Emergency Medicine | Admitting: Emergency Medicine

## 2017-11-26 ENCOUNTER — Encounter: Payer: Self-pay | Admitting: Emergency Medicine

## 2017-11-26 DIAGNOSIS — M25511 Pain in right shoulder: Secondary | ICD-10-CM

## 2017-11-26 DIAGNOSIS — F17210 Nicotine dependence, cigarettes, uncomplicated: Secondary | ICD-10-CM | POA: Insufficient documentation

## 2017-11-26 DIAGNOSIS — M7521 Bicipital tendinitis, right shoulder: Secondary | ICD-10-CM | POA: Insufficient documentation

## 2017-11-26 DIAGNOSIS — F1210 Cannabis abuse, uncomplicated: Secondary | ICD-10-CM | POA: Insufficient documentation

## 2017-11-26 MED ORDER — MELOXICAM 15 MG PO TABS: 15.0000 mg | ORAL_TABLET | Freq: Every day | ORAL | 2 refills | Status: DC

## 2017-11-26 NOTE — ED Triage Notes (Signed)
C/o left shoulder pain. Does repetitive motion at work. Pain significantly worse with extension. NAD. Ambulatory. VSS. No deformity

## 2017-11-26 NOTE — ED Notes (Signed)
See triage note  Presents with pain to right posterior shoulder  States pain started yesterday  Denies any injury  No deformity noted  Pain increases with movement
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
## 2017-11-26 NOTE — Discharge Instructions (Addendum)
Follow-up with your regular doctor or [facility] clinic orthopedics if not better in 5 to 7 days.  Apply ice to the right shoulder.  Wear the sling for 2 to 3 days.  Take the medication as prescribed.  Do not take over-the-counter ibuprofen, or Aleve with the meloxicam.  You may take Tylenol.

## 2017-12-04 ENCOUNTER — Emergency Department: Payer: Self-pay

## 2017-12-04 ENCOUNTER — Encounter: Payer: Self-pay | Admitting: Emergency Medicine

## 2017-12-04 ENCOUNTER — Emergency Department
Admission: EM | Admit: 2017-12-04 | Discharge: 2017-12-04 | Disposition: A | Discharge status: Home / Self Care | Payer: Self-pay | Attending: Student in an Organized Health Care Education/Training Program | Admitting: Student in an Organized Health Care Education/Training Program

## 2017-12-04 ENCOUNTER — Other Ambulatory Visit: Payer: Self-pay

## 2017-12-04 DIAGNOSIS — Z79899 Other long term (current) drug therapy: Secondary | ICD-10-CM | POA: Insufficient documentation

## 2017-12-04 DIAGNOSIS — M25511 Pain in right shoulder: Secondary | ICD-10-CM | POA: Insufficient documentation

## 2017-12-04 DIAGNOSIS — F17210 Nicotine dependence, cigarettes, uncomplicated: Secondary | ICD-10-CM | POA: Insufficient documentation

## 2017-12-04 MED ORDER — HYDROCODONE-ACETAMINOPHEN 5-325 MG PO TABS: 1.0000 | ORAL_TABLET | ORAL | 0 refills | Status: DC | PRN

## 2017-12-04 MED ORDER — NAPROXEN 500 MG PO TABS: 500.0000 mg | ORAL_TABLET | Freq: Two times a day (BID) | ORAL | 0 refills | Status: DC

## 2017-12-04 NOTE — ED Provider Notes (Signed)
[facility] Emergency Department Provider Note    First MD Initiated Contact with Patient 12/04/17 0408     (approximate)  I have reviewed the triage vital signs and the nursing notes.   HISTORY  Chief Complaint Shoulder Injury    HPI Larose Hiresndigo S Boback is a 27 y.o. female with no significant past medical history presents with Presley worsening right shoulder pain intermittently causes tingling sensation in her hand shooting down her arm.  Pain is worse with looking to the left.  Denies any interval trauma.  Does do very repetitive work with her hands she works in a Therapist, sportsmanufacturing line.  She denies any chest pain.  No shortness of breath.  No fevers.    History reviewed. No pertinent past medical history. No family history on file. Past Surgical History:  Procedure Laterality Date  . CESAREAN SECTION     There are no active problems to display for this patient.     Prior to Admission medications   Medication Sig Start Date End Date Taking? Authorizing Provider  HYDROcodone-acetaminophen (NORCO) 5-325 MG tablet Take 1 tablet by mouth every 4 (four) hours as needed for moderate pain. 12/04/17   Willy Eddyobinson, Nixon Kolton, MD  meloxicam (MOBIC) 15 MG tablet Take 1 tablet (15 mg total) by mouth daily. 11/26/17 11/26/18  Faythe GheeFisher, Susan W, PA-C    Allergies Patient has no known allergies.    Social History Social History   Tobacco Use  . Smoking status: Current Every Day Smoker    Last attempt to quit: 05/19/2015    Years since quitting: 2.5  . Smokeless tobacco: Never Used  Substance Use Topics  . Alcohol use: Yes  . Drug use: Yes    Types: Marijuana    Comment: Occassionally    Review of Systems Patient denies headaches, rhinorrhea, blurry vision, numbness, shortness of breath, chest pain, edema, cough, abdominal pain, nausea, vomiting, diarrhea, dysuria, fevers, rashes or hallucinations unless otherwise stated above in  HPI. ____________________________________________   PHYSICAL EXAM:  VITAL SIGNS: Vitals:   12/04/17 0230 12/04/17 0606  BP: 114/82 121/78  Pulse: 87 76  Resp: 17 16  Temp: 98.5 F (36.9 C)   SpO2: 99% 100%    Constitutional: Alert and oriented.  Eyes: Conjunctivae are normal.  Head: Atraumatic. Nose: No congestion/rhinnorhea. Mouth/Throat: Mucous membranes are moist.   Neck: No stridor. Painless ROM.  Cardiovascular: Normal rate, regular rhythm. Grossly normal heart sounds.  Good peripheral circulation. Respiratory: Normal respiratory effort.  No retractions. Lungs CTAB. Gastrointestinal: Soft and nontender. No distention. No abdominal bruits. No CVA tenderness. Genitourinary:  Musculoskeletal: No lower extremity tenderness nor edema.  No joint effusions. Neurologic:  Normal speech and language. No gross focal neurologic deficits are appreciated. No facial droop Skin:  Skin is warm, dry and intact. No rash noted. Psychiatric: Mood and affect are normal. Speech and behavior are normal.  ____________________________________________   LABS (all labs ordered are listed, but only abnormal results are displayed)  No results found for this or any previous visit (from the past 24 hour(s)). ____________________________________________ ____________________________________________  RADIOLOGY  I personally reviewed all radiographic images ordered to evaluate for the above acute complaints and reviewed radiology reports and findings.  These findings were personally discussed with the patient.  Please see medical record for radiology report.  ____________________________________________   PROCEDURES  Procedure(s) performed:  Procedures    Critical Care performed: no ____________________________________________   INITIAL IMPRESSION / ASSESSMENT AND PLAN / ED COURSE  Pertinent labs &  imaging results that were available during my care of the patient were reviewed by me and  considered in my medical decision making (see chart for details).   DDX: Cervical strain, fracture, above neuropraxia, torticollis  CATHI HAZAN is a 27 y.o. who presents to the ED with symptoms as described above.  No respiratory symptoms.  Afebrile hemodynamically stable.  No focal neuro deficits.  Orders cervical spine film to evaluate for any evidence of cervical spine dysplasia, fracture or stenosis.  She does not have any objective neuro findings I do not believe CT or MRI indicated unless there is evidence of significant abnormality on plain film.  Clinical Course as of Dec 04 629  Tue Dec 04, 2017  0527 Discussed case with patient at bedside.  Radiographs are normal.  Repeat neuro exam is intact.  Sign concerning for   [PR]    Clinical Course User Index [PR] Willy Eddy, MD     As part of my medical decision making, I reviewed the following data within the electronic MEDICAL RECORD NUMBER Nursing notes reviewed and incorporated, Labs reviewed, notes from prior ED visits.   ____________________________________________   FINAL CLINICAL IMPRESSION(S) / ED DIAGNOSES  Final diagnoses:  Acute pain of right shoulder      NEW MEDICATIONS STARTED DURING THIS VISIT:  Discharge Medication List as of 12/04/2017  5:33 AM       Note:  This document was prepared using Dragon voice recognition software and may include unintentional dictation errors.    Willy Eddy, MD 12/04/17 [phone]

## 2017-12-04 NOTE — ED Triage Notes (Addendum)
Patient ambulatory to triage with steady gait, without difficulty or distress noted; pt reports seen few wks ago for right shoulder injury; st pain persists, increasing with ROM and is causing tingling to fingers

## 2018-04-12 ENCOUNTER — Ambulatory Visit: Payer: Medicaid Other | Admitting: Maternal Newborn

## 2018-06-10 ENCOUNTER — Other Ambulatory Visit: Payer: Self-pay

## 2018-06-10 ENCOUNTER — Emergency Department
Admission: EM | Admit: 2018-06-10 | Discharge: 2018-06-10 | Disposition: A | Discharge status: Home / Self Care | Payer: Self-pay | Attending: Emergency Medicine | Admitting: Emergency Medicine

## 2018-06-10 DIAGNOSIS — R202 Paresthesia of skin: Secondary | ICD-10-CM | POA: Insufficient documentation

## 2018-06-10 DIAGNOSIS — F17210 Nicotine dependence, cigarettes, uncomplicated: Secondary | ICD-10-CM | POA: Insufficient documentation

## 2018-06-10 NOTE — Discharge Instructions (Addendum)
Follow-up with your primary care provider if any continued problems or not improving.  Begin taking Aleve 2 tablets twice a day with food.  Wear wrist splint for protection and support.  If not improving you will need to see your primary care provider to arrange for nerve conduction studies.

## 2018-06-10 NOTE — ED Triage Notes (Signed)
PT arrived POV with tingling in her left hand. Pt was drinking with a friend last night and woke up to her pinky and ring finger numb and tingling and still has not stopped. No pain, dizziness, or LOC.

## 2018-06-10 NOTE — ED Provider Notes (Addendum)
[facility] Emergency Department Provider Note   ____________________________________________   First MD Initiated Contact with Patient 06/10/18 1024     (approximate)  I have reviewed the triage vital signs and the nursing notes.   HISTORY  Chief Complaint  Numbness  HPI Shelly Hamilton is a 28 y.o. female presents to the ED with complaint of tingling and numbness sensation to her left fourth and fifth finger.  Patient states that this began the morning after she drank alcohol with a friend but is not specific on how much alcohol she had.  She states that she woke up with her chin on top of her hand and is uncertain as to how long she had slept like this.  Patient states that she contacted her friend who states that she is unaware of any injury to her hand.  Patient denies any previous injury to her hand.  She reports a tingling sensation but is still able to move her fingers.  She denies any pain.      History reviewed. No pertinent past medical history.  There are no active problems to display for this patient.   Past Surgical History:  Procedure Laterality Date  . CESAREAN SECTION      Prior to Admission medications   Not on File    Allergies Patient has no known allergies.  History reviewed. No pertinent family history.  Social History Social History   Tobacco Use  . Smoking status: Current Every Day Smoker    Last attempt to quit: 05/19/2015    Years since quitting: 3.0  . Smokeless tobacco: Never Used  Substance Use Topics  . Alcohol use: Yes  . Drug use: Yes    Types: Marijuana    Comment: Occassionally    Review of Systems Constitutional: No fever/chills Eyes: No visual changes. Cardiovascular: Denies chest pain. Respiratory: Denies shortness of breath. Musculoskeletal: Positive paresthesia left fourth and fifth finger. Skin: Negative for rash. Neurological: Negative for headaches ___________________________________________    PHYSICAL EXAM:  VITAL SIGNS: ED Triage Vitals  Enc Vitals Group     BP 06/10/18 1020 119/87     Pulse Rate 06/10/18 1020 86     Resp --      Temp 06/10/18 1020 98.1 F (36.7 C)     Temp Source 06/10/18 1020 Oral     SpO2 06/10/18 1020 100 %     Weight 06/10/18 1014 125 lb (56.7 kg)     Height 06/10/18 1014 5\' 3"  (1.6 m)     Head Circumference --      Peak Flow --      Pain Score 06/10/18 1014 0     Pain Loc --      Pain Edu? --      Excl. in GC? --    Constitutional: Alert and oriented. Well appearing and in no acute distress. Eyes: Conjunctivae are normal.  Head: Atraumatic. Neck: No stridor.   Cardiovascular: Normal rate, regular rhythm. Grossly normal heart sounds.  Good peripheral circulation. Respiratory: Normal respiratory effort.  No retractions. Lungs CTAB. Musculoskeletal: On examination of left hand there is no gross deformity no soft tissue swelling present.  Patient is able to flex and extend her fingers without any difficulty.  There is some minimal decrease in her grip ability with 2 digits.  Skin is intact.  No evidence of injury.  Capillary refill is extremely difficult to assess since patient has acrylic nails on.  No joint swelling is seen.  Pulses present.  There is tenderness in the ulnar nerve region without evidence of injury. Neurologic:  Normal speech and language. No gross focal neurologic deficits are appreciated.  Skin:  Skin is warm, dry and intact. No rash noted. Psychiatric: Mood and affect are normal. Speech and behavior are normal.  ____________________________________________   LABS (all labs ordered are listed, but only abnormal results are displayed)  Labs Reviewed - No data to display ____________________________________________   PROCEDURES  Procedure(s) performed (including Critical Care):  Procedures Patient was placed in a cock-up wrist splint  ____________________________________________   INITIAL IMPRESSION / ASSESSMENT  AND PLAN / ED COURSE  As part of my medical decision making, I reviewed the following data within the electronic MEDICAL RECORD NUMBER Notes from prior ED visits and [place] Controlled Substance Database    Patient was placed in a cock-up wrist splint for support and for protection.  She is also encouraged to take anti-inflammatories as needed for inflammation and to follow-up with her PCP if any continued problems for further testing and possible nerve conduction study.  ___________________________________________   FINAL CLINICAL IMPRESSION(S) / ED DIAGNOSES  Final diagnoses:  Paresthesias in left hand     ED Discharge Orders    None       Note:  This document was prepared using Dragon voice recognition software and may include unintentional dictation errors.    Tommi Rumps, PA-C 06/10/18 1530    Tommi Rumps, PA-C 06/10/18 1533    Jene Every, MD 06/10/18 [phone]

## 2018-08-05 ENCOUNTER — Ambulatory Visit: Payer: Medicaid Other | Admitting: Advanced Practice Midwife

## 2018-08-13 ENCOUNTER — Ambulatory Visit: Payer: Medicaid Other | Admitting: Obstetrics and Gynecology

## 2018-08-21 ENCOUNTER — Other Ambulatory Visit: Payer: Self-pay

## 2018-08-21 ENCOUNTER — Emergency Department
Admission: EM | Admit: 2018-08-21 | Discharge: 2018-08-22 | Disposition: A | Discharge status: Other Acute Inpatient Hospital | Payer: Self-pay | Attending: Emergency Medicine | Admitting: Emergency Medicine

## 2018-08-21 ENCOUNTER — Encounter: Payer: Self-pay | Admitting: Emergency Medicine

## 2018-08-21 DIAGNOSIS — Z1159 Encounter for screening for other viral diseases: Secondary | ICD-10-CM | POA: Diagnosis not present

## 2018-08-21 DIAGNOSIS — S3991XA Unspecified injury of abdomen, initial encounter: Secondary | ICD-10-CM | POA: Insufficient documentation

## 2018-08-21 DIAGNOSIS — F17200 Nicotine dependence, unspecified, uncomplicated: Secondary | ICD-10-CM | POA: Insufficient documentation

## 2018-08-21 DIAGNOSIS — S2232XA Fracture of one rib, left side, initial encounter for closed fracture: Secondary | ICD-10-CM

## 2018-08-21 DIAGNOSIS — Y929 Unspecified place or not applicable: Secondary | ICD-10-CM | POA: Diagnosis not present

## 2018-08-21 DIAGNOSIS — S22029A Unspecified fracture of second thoracic vertebra, initial encounter for closed fracture: Secondary | ICD-10-CM | POA: Diagnosis not present

## 2018-08-21 DIAGNOSIS — S270XXA Traumatic pneumothorax, initial encounter: Secondary | ICD-10-CM | POA: Diagnosis not present

## 2018-08-21 DIAGNOSIS — Y9389 Activity, other specified: Secondary | ICD-10-CM | POA: Insufficient documentation

## 2018-08-21 DIAGNOSIS — S22009A Unspecified fracture of unspecified thoracic vertebra, initial encounter for closed fracture: Secondary | ICD-10-CM

## 2018-08-21 DIAGNOSIS — R079 Chest pain, unspecified: Secondary | ICD-10-CM | POA: Diagnosis present
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
## 2018-08-22 ENCOUNTER — Emergency Department: Payer: Self-pay

## 2018-08-22 LAB — POCT PREGNANCY, URINE: Preg Test, Ur: NEGATIVE

## 2018-08-22 LAB — SARS CORONAVIRUS 2 BY RT PCR (HOSPITAL ORDER, PERFORMED IN ~~LOC~~ HOSPITAL LAB): SARS Coronavirus 2: NEGATIVE

## 2018-08-22 MED ORDER — IOHEXOL 300 MG/ML  SOLN
100.0000 mL | Freq: Once | INTRAMUSCULAR | Status: AC | PRN
  Administered 2018-08-22: 100 mL via INTRAVENOUS

## 2018-08-22 MED ORDER — GENERIC EXTERNAL MEDICATION
Status: DC
Start: ? — End: 2018-08-22

## 2018-08-22 MED ORDER — ACETAMINOPHEN 325 MG PO TABS
650.00 | ORAL_TABLET | ORAL | Status: DC
Start: 2018-08-23 — End: 2018-08-22

## 2018-08-22 MED ORDER — ENOXAPARIN SODIUM 40 MG/0.4ML ~~LOC~~ SOLN
40.00 | SUBCUTANEOUS | Status: DC
Start: 2018-08-24 — End: 2018-08-22

## 2018-08-22 MED ORDER — KETOROLAC TROMETHAMINE 30 MG/ML IJ SOLN
15.0000 mg | Freq: Once | INTRAMUSCULAR | Status: AC
  Administered 2018-08-22: 01:00:00 15 mg via INTRAVENOUS
  Filled 2018-08-22: qty 1

## 2018-08-22 NOTE — ED Notes (Signed)
EDP in with patient 
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
## 2018-08-22 NOTE — ED Notes (Signed)
Patient placed on 2L per EDP.

## 2018-08-22 NOTE — ED Notes (Signed)
EMTALA reviewed by charge RN 

## 2018-08-23 MED ORDER — GENERIC EXTERNAL MEDICATION
Status: DC
Start: ? — End: 2018-08-23

## 2019-06-04 ENCOUNTER — Other Ambulatory Visit: Payer: Self-pay

## 2019-06-04 ENCOUNTER — Other Ambulatory Visit (HOSPITAL_COMMUNITY)
Admission: RE | Admit: 2019-06-04 | Discharge: 2019-06-04 | Disposition: A | Discharge status: Home / Self Care | Payer: Medicaid Other | Source: Ambulatory Visit | Attending: Obstetrics and Gynecology | Admitting: Obstetrics and Gynecology

## 2019-06-04 ENCOUNTER — Ambulatory Visit (INDEPENDENT_AMBULATORY_CARE_PROVIDER_SITE_OTHER): Payer: Medicaid Other | Admitting: Obstetrics and Gynecology

## 2019-06-04 ENCOUNTER — Encounter: Payer: Self-pay | Admitting: Obstetrics and Gynecology

## 2019-06-04 VITALS — BP 102/50 | Temp 97.6°F | Ht 62.0 in | Wt 128.0 lb

## 2019-06-04 DIAGNOSIS — Z0001 Encounter for general adult medical examination with abnormal findings: Secondary | ICD-10-CM | POA: Diagnosis not present

## 2019-06-04 DIAGNOSIS — R102 Pelvic and perineal pain: Secondary | ICD-10-CM | POA: Diagnosis not present

## 2019-06-04 DIAGNOSIS — N930 Postcoital and contact bleeding: Secondary | ICD-10-CM

## 2019-06-04 DIAGNOSIS — F331 Major depressive disorder, recurrent, moderate: Secondary | ICD-10-CM

## 2019-06-04 DIAGNOSIS — Z124 Encounter for screening for malignant neoplasm of cervix: Secondary | ICD-10-CM | POA: Diagnosis present

## 2019-06-04 DIAGNOSIS — Z01411 Encounter for gynecological examination (general) (routine) with abnormal findings: Secondary | ICD-10-CM

## 2019-06-04 DIAGNOSIS — Z23 Encounter for immunization: Secondary | ICD-10-CM | POA: Diagnosis not present

## 2019-06-04 DIAGNOSIS — Z113 Encounter for screening for infections with a predominantly sexual mode of transmission: Secondary | ICD-10-CM

## 2019-06-04 DIAGNOSIS — Z0000 Encounter for general adult medical examination without abnormal findings: Secondary | ICD-10-CM

## 2019-06-04 NOTE — Progress Notes (Signed)
Gynecology Annual Exam   PCP: Patient, No Pcp Per  Chief Complaint:  Chief Complaint  Patient presents with  . Gynecologic Exam    Spotting last period never full period     History of Present Illness: Patient is a 29 y.o. G2P1011 presents for annual exam. The patient has no complaints today.   LMP: Patient's last menstrual period was 05/18/2019 (approximate). Average Interval: regular, 28 days Duration of flow: 5 days Heavy Menses: no Clots: no Intermenstrual Bleeding: yes Postcoital Bleeding: yes Dysmenorrhea: no  The patient is sexually active. She currently uses withdrawl for contraception. She admits to dyspareunia.  The patient does perform self breast exams.  There is no notable family history of breast or ovarian cancer in her family.  The patient has regular exercise: yes.    The patient reports current symptoms of depression.  She reports that she has had symptoms of depression for 4 to 6 years.  She is to speak with her church therapist which helped her a lot with managing the symptoms.  She has not spoken to her church therapist recently because of Covid.  She is not interested in any antidepressants at this time.  She was provided with a list of local therapist and plans on following up again with her church therapist. PHQ-9 score is 11 GAD-7 score is 4  She reports that about a month ago she had a severe sharp crampy pain on her right side.  This pain lasted for 2 to 3 days and she ignored it.  She denies any changes with her bowel movements.  She denies any nausea vomiting.  This pain still somewhat lingers and she is concerned about it.  She reports some pain after intercourse as well as some bleeding. She has a remote history of gonorrhea and chlamydia in the past it has been about 8 years since she last had an sexually transmitted infection.   Review of Systems: Review of Systems  Constitutional: Negative for chills, fever, malaise/fatigue and weight  loss.  HENT: Negative for congestion, hearing loss and sinus pain.   Eyes: Negative for blurred vision and double vision.  Respiratory: Negative for cough, sputum production, shortness of breath and wheezing.   Cardiovascular: Negative for chest pain, palpitations, orthopnea and leg swelling.  Gastrointestinal: Negative for abdominal pain, constipation, diarrhea, nausea and vomiting.  Genitourinary: Negative for dysuria, flank pain, frequency, hematuria and urgency.  Musculoskeletal: Negative for back pain, falls and joint pain.  Skin: Negative for itching and rash.  Neurological: Negative for dizziness and headaches.  Psychiatric/Behavioral: Negative for depression, substance abuse and suicidal ideas. The patient is not nervous/anxious.     Past Medical History:  There are no problems to display for this patient.   Past Surgical History:  Past Surgical History:  Procedure Laterality Date  . CESAREAN SECTION      Gynecologic History:  Patient's last menstrual period was 05/18/2019 (approximate). Contraception: withdrawl Last Pap: Results were: unknown   Obstetric History: G2P1011  Family History:  History reviewed. No pertinent family history.  Social History:  Social History   Socioeconomic History  . Marital status: Single    Spouse name: Not on file  . Number of children: Not on file  . Years of education: Not on file  . Highest education level: Not on file  Occupational History  . Not on file  Tobacco Use  . Smoking status: Current Every Day Smoker    Last attempt to quit: 05/19/2015  Years since quitting: 4.0  . Smokeless tobacco: Never Used  Substance and Sexual Activity  . Alcohol use: Yes  . Drug use: Yes    Types: Marijuana    Comment: Occassionally  . Sexual activity: Yes    Birth control/protection: None  Other Topics Concern  . Not on file  Social History Narrative  . Not on file   Social Determinants of Health   Financial Resource Strain:     . Difficulty of Paying Living Expenses:   Food Insecurity:   . Worried About Charity fundraiser in the Last Year:   . Arboriculturist in the Last Year:   Transportation Needs:   . Film/video editor (Medical):   Marland Kitchen Lack of Transportation (Non-Medical):   Physical Activity:   . Days of Exercise per Week:   . Minutes of Exercise per Session:   Stress:   . Feeling of Stress :   Social Connections:   . Frequency of Communication with Friends and Family:   . Frequency of Social Gatherings with Friends and Family:   . Attends Religious Services:   . Active Member of Clubs or Organizations:   . Attends Archivist Meetings:   Marland Kitchen Marital Status:   Intimate Partner Violence:   . Fear of Current or Ex-Partner:   . Emotionally Abused:   Marland Kitchen Physically Abused:   . Sexually Abused:     Allergies:  No Known Allergies  Medications: Prior to Admission medications   Not on File    Physical Exam Vitals: Blood pressure (!) 102/50, temperature 97.6 F (36.4 C), height 5\' 2"  (1.575 m), weight 128 lb (58.1 kg), last menstrual period 05/18/2019, not currently breastfeeding.  General: NAD HEENT: normocephalic, anicteric Thyroid: no enlargement, no palpable nodules Pulmonary: No increased work of breathing, CTAB Cardiovascular: RRR, distal pulses 2+ Breast: Breast symmetrical, no tenderness, no palpable nodules or masses, no skin or nipple retraction present, no nipple discharge.  No axillary or supraclavicular lymphadenopathy. Abdomen: NABS, soft, non-tender, non-distended.  Umbilicus without lesions.  No hepatomegaly, splenomegaly or masses palpable. No evidence of hernia  Genitourinary:  External: Normal external female genitalia.  Normal urethral meatus, normal Bartholin's and Skene's glands.    Vagina: Normal vaginal mucosa, no evidence of prolapse.    Cervix: Grossly normal in appearance, no bleeding  Uterus: Non-enlarged, mobile, normal contour.  No CMT  Adnexa: ovaries  non-enlarged, no adnexal masses  Rectal: deferred  Lymphatic: no evidence of inguinal lymphadenopathy Extremities: no edema, erythema, or tenderness Neurologic: Grossly intact Psychiatric: mood appropriate, affect full  Female chaperone present for pelvic and breast  portions of the physical exam    Assessment: 29 y.o. G2P1011 routine annual exam  Plan: Problem List Items Addressed This Visit    None    Visit Diagnoses    Cervical cancer screening    -  Primary   Relevant Orders   Cytology - PAP   Healthcare maintenance       Moderate episode of recurrent major depressive disorder ([place])       Pelvic pain       Relevant Orders   US PELVIS TRANSVAGINAL NON-OB (TV ONLY)   Screening for STD (sexually transmitted disease)       Relevant Orders   NuSwab Vaginitis Plus (VG+)   Bleeding after intercourse       Relevant Orders   NuSwab Vaginitis Plus (VG+)      2) STI screening  was offered and accepted  2)  ASCCP guidelines and rational discussed.  Patient opts for every 3 years screening interval  3) Contraception - the patient is currently using  withdrawl.  She is happy with her current form of contraception and plans to continue. Urine pregnancy test in office today was negative  4) Routine healthcare maintenance including cholesterol, diabetes screening discussed managed by PCP  5) Desires vaccination for HPV- series initiated today. Given brochure. Explained timing of subsequent vaccinations  6) Pelvic pain and bleeding after intercourse- will follow up for [place].  7) Return in about 2 weeks (around 06/18/2019) for GYN visit and [place], 2 months for nurse visit.  Adelene Idler MD [facility], [facility] 06/04/2019 11:02 AM

## 2019-06-04 NOTE — Patient Instructions (Signed)
Living With Depression Everyone experiences occasional disappointment, sadness, and loss in their lives. When you are feeling down, blue, or sad for at least 2 weeks in a row, it may mean that you have depression. Depression can affect your thoughts and feelings, relationships, daily activities, and physical health. It is caused by changes in the way your brain functions. If you receive a diagnosis of depression, your health care provider will tell you which type of depression you have and what treatment options are available to you. If you are living with depression, there are ways to help you recover from it and also ways to prevent it from coming back. How to cope with lifestyle changes Coping with stress     Stress is your body's reaction to life changes and events, both good and bad. Stressful situations may include:  Getting married.  The death of a spouse.  Losing a job.  Retiring.  Having a baby. Stress can last just a few hours or it can be ongoing. Stress can play a major role in depression, so it is important to learn both how to cope with stress and how to think about it differently. Talk with your health care provider or a counselor if you would like to learn more about stress reduction. He or she may suggest some stress reduction techniques, such as:  Music therapy. This can include creating music or listening to music. Choose music that you enjoy and that inspires you.  Mindfulness-based meditation. This kind of meditation can be done while sitting or walking. It involves being aware of your normal breaths, rather than trying to control your breathing.  Centering prayer. This is a kind of meditation that involves focusing on a spiritual word or phrase. Choose a word, phrase, or sacred image that is meaningful to you and that brings you peace.  Deep breathing. To do this, expand your stomach and inhale slowly through your nose. Hold your breath for 3-5 seconds, then exhale  slowly, allowing your stomach muscles to relax.  Muscle relaxation. This involves intentionally tensing muscles then relaxing them. Choose a stress reduction technique that fits your lifestyle and personality. Stress reduction techniques take time and practice to develop. Set aside 5-15 minutes a day to do them. Therapists can offer training in these techniques. The training may be covered by some insurance plans. Other things you can do to manage stress include:  Keeping a stress diary. This can help you learn what triggers your stress and ways to control your response.  Understanding what your limits are and saying no to requests or events that lead to a schedule that is too full.  Thinking about how you respond to certain situations. You may not be able to control everything, but you can control how you react.  Adding humor to your life by watching funny films or TV shows.  Making time for activities that help you relax and not feeling guilty about spending your time this way.  Medicines Your health care provider may suggest certain medicines if he or she feels that they will help improve your condition. Avoid using alcohol and other substances that may prevent your medicines from working properly (may interact). It is also important to:  Talk with your pharmacist or health care provider about all the medicines that you take, their possible side effects, and what medicines are safe to take together.  Make it your goal to take part in all treatment decisions (shared decision-making). This includes giving input on   the side effects of medicines. It is best if shared decision-making with your health care provider is part of your total treatment plan. If your health care provider prescribes a medicine, you may not notice the full benefits of it for 4-8 weeks. Most people who are treated for depression need to be on medicine for at least 6-12 months after they feel better. If you are taking  medicines as part of your treatment, do not stop taking medicines without first talking to your health care provider. You may need to have the medicine slowly decreased (tapered) over time to decrease the risk of harmful side effects. Relationships Your health care provider may suggest family therapy along with individual therapy and drug therapy. While there may not be family problems that are causing you to feel depressed, it is still important to make sure your family learns as much as they can about your mental health. Having your family's support can help make your treatment successful. How to recognize changes in your condition Everyone has a different response to treatment for depression. Recovery from major depression happens when you have not had signs of major depression for two months. This may mean that you will start to:  Have more interest in doing activities.  Feel less hopeless than you did 2 months ago.  Have more energy.  Overeat less often, or have better or improving appetite.  Have better concentration. Your health care provider will work with you to decide the next steps in your recovery. It is also important to recognize when your condition is getting worse. Watch for these signs:  Having fatigue or low energy.  Eating too much or too little.  Sleeping too much or too little.  Feeling restless, agitated, or hopeless.  Having trouble concentrating or making decisions.  Having unexplained physical complaints.  Feeling irritable, angry, or aggressive. Get help as soon as you or your family members notice these symptoms coming back. How to get support and help from others How to talk with friends and family members about your condition  Talking to friends and family members about your condition can provide you with one way to get support and guidance. Reach out to trusted friends or family members, explain your symptoms to them, and let them know that you are  working with a health care provider to treat your depression. Financial resources Not all insurance plans cover mental health care, so it is important to check with your insurance carrier. If paying for co-pays or counseling services is a problem, search for a local or county mental health care center. They may be able to offer public mental health care services at low or no cost when you are not able to see a private health care provider. If you are taking medicine for depression, you may be able to get the generic form, which may be less expensive. Some makers of prescription medicines also offer help to patients who cannot afford the medicines they need. Follow these instructions at home:   Get the right amount and quality of sleep.  Cut down on using caffeine, tobacco, alcohol, and other potentially harmful substances.  Try to exercise, such as walking or lifting small weights.  Take over-the-counter and prescription medicines only as told by your health care provider.  Eat a healthy diet that includes plenty of vegetables, fruits, whole grains, low-fat dairy products, and lean protein. Do not eat a lot of foods that are high in solid fats, added sugars, or salt.    Keep all follow-up visits as told by your health care provider. This is important. Contact a health care provider if:  You stop taking your antidepressant medicines, and you have any of these symptoms: ? Nausea. ? Headache. ? Feeling lightheaded. ? Chills and body aches. ? Not being able to sleep (insomnia).  You or your friends and family think your depression is getting worse. Get help right away if:  You have thoughts of hurting yourself or others. If you ever feel like you may hurt yourself or others, or have thoughts about taking your own life, get help right away. You can go to your nearest emergency department or call:  Your local emergency services (911 in the [place]  A suicide crisis helpline, such as the  National Suicide Prevention Lifeline at 229 257 8373. This is open 24-hours a day. Summary  If you are living with depression, there are ways to help you recover from it and also ways to prevent it from coming back.  Work with your health care team to create a management plan that includes counseling, stress management techniques, and healthy lifestyle habits. This information is not intended to replace advice given to you by your health care provider. Make sure you discuss any questions you have with your health care provider. Document Revised: 06/28/2018 Document Reviewed: 02/07/2016 Elsevier Patient Education  2020 [employer].     [facility], [place] Same Day Access Hours:  Monday, Wednesday and Friday, 8am - 3pm Walk-In Crisis Hours: 7 days/wk, 8am - 8pm [address], [place], [place] [address] [phone]  [employer] [phone]  Mobile Crisis Unit [phone]   Therapists/Counselors/Psychologists   Karen [place], [place]  & Jacqlyn Krauss [place]    [phone]        [address]       [place], [place] [address]        Ival Bible, CSW [phone] [address] [place], [place] [address]  Harle Battiest, [place]        [phone]        [address]      [place], [place] [address]        Chyrel Masson, [place] [phone] [address] [place], [place] [address]   Oscar La, LMFT       [phone]        [address]       [place], [place] [address]        Felecia Jan [phone] [address] [place], [place] [address]  Lester [place]        [phone]        [address]       [place], [place] [address]        Kerin Salen [phone] [address] [place], [place] [address]  Tyron Russell, PsyD       [phone]        [address]       [place], [place] [address]        Elita Quick, LPC [phone] [address]  [place], [place] [address]   Debarah Crape        [phone]        [address] [place], [place] [address]         Rosana Hoes [facility] [phone] [email or web address]   Sation Konchella       [phone]  [address] [place], [place] [address]        [place] [phone] [email or web address] .[place] Program:   [email or web address]

## 2019-06-05 NOTE — Addendum Note (Signed)
Addended by: Desmond Dike on: 06/05/2019 08:10 AM   Modules accepted: Orders

## 2019-06-07 LAB — NUSWAB VAGINITIS PLUS (VG+)
Atopobium vaginae: HIGH Score — AB
BVAB 2: HIGH Score — AB
Candida albicans, NAA: NEGATIVE
Candida glabrata, NAA: NEGATIVE
Chlamydia trachomatis, NAA: NEGATIVE
Megasphaera 1: HIGH Score — AB
Neisseria gonorrhoeae, NAA: NEGATIVE
Trich vag by NAA: NEGATIVE

## 2019-06-09 ENCOUNTER — Other Ambulatory Visit: Payer: Self-pay | Admitting: Obstetrics and Gynecology

## 2019-06-09 DIAGNOSIS — B9689 Other specified bacterial agents as the cause of diseases classified elsewhere: Secondary | ICD-10-CM

## 2019-06-09 MED ORDER — METRONIDAZOLE 500 MG PO TABS: 500.0000 mg | ORAL_TABLET | Freq: Two times a day (BID) | ORAL | 0 refills | Status: AC

## 2019-06-12 LAB — CYTOLOGY - PAP
Comment: NEGATIVE
Comment: NEGATIVE
Comment: NEGATIVE
Diagnosis: NEGATIVE
HPV 16: NEGATIVE
HPV 18 / 45: NEGATIVE
High risk HPV: POSITIVE — AB

## 2019-06-24 ENCOUNTER — Encounter: Payer: Self-pay | Admitting: Obstetrics and Gynecology

## 2019-06-24 ENCOUNTER — Ambulatory Visit (INDEPENDENT_AMBULATORY_CARE_PROVIDER_SITE_OTHER): Payer: Medicaid Other

## 2019-06-24 ENCOUNTER — Ambulatory Visit (INDEPENDENT_AMBULATORY_CARE_PROVIDER_SITE_OTHER): Payer: Medicaid Other | Admitting: Obstetrics and Gynecology

## 2019-06-24 ENCOUNTER — Other Ambulatory Visit: Payer: Self-pay

## 2019-06-24 VITALS — BP 110/70 | Ht 62.0 in | Wt 126.0 lb

## 2019-06-24 DIAGNOSIS — N930 Postcoital and contact bleeding: Secondary | ICD-10-CM

## 2019-06-24 DIAGNOSIS — R102 Pelvic and perineal pain: Secondary | ICD-10-CM

## 2019-06-24 DIAGNOSIS — N760 Acute vaginitis: Secondary | ICD-10-CM | POA: Diagnosis not present

## 2019-06-24 DIAGNOSIS — Z23 Encounter for immunization: Secondary | ICD-10-CM

## 2019-06-24 DIAGNOSIS — B9689 Other specified bacterial agents as the cause of diseases classified elsewhere: Secondary | ICD-10-CM

## 2019-06-24 NOTE — Progress Notes (Signed)
Patient ID: Shelly Hamilton, female   DOB: 01-Sep-1990, 29 y.o.   MRN: [id number]  Reason for Consult: Follow-up (GYN u/s follow up )   Referred by Murel Shenberger R, *  Subjective:     HPI:  Shelly Hamilton is a 29 y.o. female she is a 29 year old who is following up for several issues.  She reports that her mood has been improving since I last saw her.  She recently went on vacation and feels like that helped a lot.  She was prescribed Flagyl for bacterial vaginosis and just initiated treatment yesterday.  She has been having pain with intercourse but does report that her partner has a curved penis and believes that some of this pain is related to that and positional.  She reports that her periods are normal regular and generally not painful other than mild cramps.  History reviewed. No pertinent past medical history. History reviewed. No pertinent family history. Past Surgical History:  Procedure Laterality Date  . CESAREAN SECTION      Short Social History:  Social History   Tobacco Use  . Smoking status: Current Every Day Smoker    Last attempt to quit: 05/19/2015    Years since quitting: 4.1  . Smokeless tobacco: Never Used  Substance Use Topics  . Alcohol use: Yes    No Known Allergies  No current outpatient medications on file.   No current facility-administered medications for this visit.    Review of Systems  Constitutional: Negative for chills, fatigue, fever and unexpected weight change.  HENT: Negative for trouble swallowing.  Eyes: Negative for loss of vision.  Respiratory: Negative for cough, shortness of breath and wheezing.  Cardiovascular: Negative for chest pain, leg swelling, palpitations and syncope.  GI: Negative for abdominal pain, blood in stool, diarrhea, nausea and vomiting.  GU: Negative for difficulty urinating, dysuria, frequency and hematuria.  Musculoskeletal: Negative for back pain, leg pain and joint pain.  Skin: Negative for rash.    Neurological: Negative for dizziness, headaches, light-headedness, numbness and seizures.  Psychiatric: Negative for behavioral problem, confusion, depressed mood and sleep disturbance.        Objective:  Objective   Vitals:   06/24/19 0918  BP: 110/70  Weight: 126 lb (57.2 kg)  Height: 5\' 2"  (1.575 m)   Body mass index is 23.05 kg/m.  Physical Exam Vitals and nursing note reviewed.  Constitutional:      Appearance: She is well-developed.  HENT:     Head: Normocephalic and atraumatic.  Eyes:     Pupils: Pupils are equal, round, and reactive to light.  Cardiovascular:     Rate and Rhythm: Normal rate and regular rhythm.  Pulmonary:     Effort: Pulmonary effort is normal. No respiratory distress.  Skin:    General: Skin is warm and dry.  Neurological:     Mental Status: She is alert and oriented to person, place, and time.  Psychiatric:        Behavior: Behavior normal.        Thought Content: Thought content normal.        Judgment: Judgment normal.        Assessment/Plan:     29 yo 1. Bacterial Vaginosis-Just started treatment for BV-we will complete therapy and let me know if bleeding after intercourse continues.  No evidence of polyps or submucosal fibroid noted on today's exam. 2. Discussed NIL HPV positive pap (negative 16/18/45). Repeat pap smear in 3 years 3. Gardasil  vaccination series ongoing..  First shot was last month.  Will follow up in 1 month for HPV vaccine and again in September 2021 4. Minimal pain with intercourse and periods.  Pelvic ultrasound today was normal patient was reassured and will follow up as needed.  More than 20 minutes were spent face to face with the patient in the room, reviewing the medical record, labs and images, and coordinating care for the patient. The plan of management was discussed in detail and counseling was provided.

## 2019-07-17 ENCOUNTER — Other Ambulatory Visit: Payer: Self-pay

## 2019-07-17 ENCOUNTER — Encounter: Payer: Self-pay | Admitting: Emergency Medicine

## 2019-07-17 ENCOUNTER — Emergency Department
Admission: EM | Admit: 2019-07-17 | Discharge: 2019-07-17 | Disposition: A | Discharge status: Home / Self Care | Payer: Medicaid Other | Attending: Emergency Medicine | Admitting: Emergency Medicine

## 2019-07-17 DIAGNOSIS — N644 Mastodynia: Secondary | ICD-10-CM | POA: Diagnosis not present

## 2019-07-17 DIAGNOSIS — F17210 Nicotine dependence, cigarettes, uncomplicated: Secondary | ICD-10-CM | POA: Diagnosis not present

## 2019-07-17 MED ORDER — SULFAMETHOXAZOLE-TRIMETHOPRIM 800-160 MG PO TABS: 1.0000 | ORAL_TABLET | Freq: Two times a day (BID) | ORAL | 0 refills | Status: DC

## 2019-07-17 MED ORDER — HYDROCODONE-ACETAMINOPHEN 5-325 MG PO TABS: 1.0000 | ORAL_TABLET | Freq: Four times a day (QID) | ORAL | 0 refills | Status: AC | PRN

## 2019-07-17 NOTE — ED Provider Notes (Signed)
[facility] Emergency Department Provider Note  ____________________________________________  Time seen: Approximately 9:13 AM  I have reviewed the triage vital signs and the nursing notes.   HISTORY  Chief Complaint Breast Pain   HPI Shelly Hamilton is a 29 y.o. female who presents to the emergency department for treatment and evaluation of pain to her right breast x 2 days that is getting worse. She has had tenderness while on menstrual cycle in the past, but she can now feel a "knot." She denies known immediate family history of breast cancer. She denies nipple discharge or skin peeling from nipple. No alleviating measures prior to arrival.   History reviewed. No pertinent past medical history.  There are no problems to display for this patient.   Past Surgical History:  Procedure Laterality Date  . CESAREAN SECTION      Prior to Admission medications   Not on File    Allergies Patient has no known allergies.  No family history on file.  Social History Social History   Tobacco Use  . Smoking status: Current Every Day Smoker    Last attempt to quit: 05/19/2015    Years since quitting: 4.1  . Smokeless tobacco: Never Used  Substance Use Topics  . Alcohol use: Yes  . Drug use: Yes    Types: Marijuana    Comment: Occassionally    Review of Systems  Constitutional: Negative for fever. Respiratory: Negative for cough or shortness of breath.  Musculoskeletal: Negative for myalgias Skin: Positive for right breast tenderness Neurological: Negative for numbness or paresthesias. ____________________________________________   PHYSICAL EXAM:  VITAL SIGNS: ED Triage Vitals  Enc Vitals Group     BP 07/17/19 0816 118/78     Pulse Rate 07/17/19 0816 92     Resp 07/17/19 0816 20     Temp 07/17/19 0816 98.2 F (36.8 C)     Temp Source 07/17/19 0816 Oral     SpO2 07/17/19 0816 100 %     Weight 07/17/19 0817 125 lb (56.7 kg)     Height  07/17/19 0817 5\' 3"  (1.6 m)     Head Circumference --      Peak Flow --      Pain Score 07/17/19 0817 7     Pain Loc --      Pain Edu? --      Excl. in GC? --      Constitutional: Well appearing. Eyes: Conjunctivae are clear without discharge or drainage. Nose: No rhinorrhea noted. Mouth/Throat: Airway is patent.  Neck: No stridor. Unrestricted range of motion observed. Cardiovascular: Capillary refill is <3 seconds.  Respiratory: Respirations are even and unlabored.. Musculoskeletal: Unrestricted range of motion observed. Neurologic: Awake, alert, and oriented x 4.  Skin: Tender, palpable mobile cystic like lesion near the skin fold of breast tissue and right lower breast about 5 o'clock position. No fluctuance or induration.  ____________________________________________   LABS (all labs ordered are listed, but only abnormal results are displayed)  Labs Reviewed - No data to display ____________________________________________  EKG  Not indicated. ____________________________________________  RADIOLOGY  Not indicated. ____________________________________________   PROCEDURES  Procedures ____________________________________________   INITIAL IMPRESSION / ASSESSMENT AND PLAN / ED COURSE  Shelly Hamilton is a 29 y.o. female presents to the emergency department for treatment and evaluation of breast tenderness. See HPI for further details.   Exam does reveal a small cystic lesion without induration or fluctuance. Plan will be to start her on antibiotic and  have her follow up with the breast care center for ultrasound/mammogram.   She is to return to the ER for symptoms that change or worsen if unable to schedule an appointment.   Medications - No data to display   Pertinent labs & imaging results that were available during my care of the patient were reviewed by me and considered in my medical decision making (see chart for details).   ____________________________________________   FINAL CLINICAL IMPRESSION(S) / ED DIAGNOSES  Final diagnoses:  None    ED Discharge Orders    None       Note:  This document was prepared using Dragon voice recognition software and may include unintentional dictation errors.   Victorino Dike, FNP 07/19/19 1884    Nance Pear, MD 07/20/19 [phone]

## 2019-07-17 NOTE — ED Triage Notes (Signed)
Patient to ER for c/o right breast pain. Patient states she noticed pain yesterday, but typically has breast tenderness with periods (on period now). Reports feeling "knot" at inner/lower aspect of right breast. No abscess or redness visible on inspection.

## 2019-07-17 NOTE — Discharge Instructions (Signed)
Follow-up with primary care and schedule an appointment with the breast care center as well.  Return to the emergency department for symptoms of change or worsen or for new concerns if you are unable to schedule an appointment.

## 2019-07-17 NOTE — ED Notes (Signed)
See triage note  Presents with tenderness to right breast area for couple of days  Hx of breast tenderness with periods but now she is feeling a knot   Denies any draiange

## 2019-07-18 ENCOUNTER — Telehealth: Payer: Medicaid Other

## 2019-07-22 ENCOUNTER — Ambulatory Visit: Payer: Medicaid Other | Admitting: Obstetrics and Gynecology

## 2019-08-04 ENCOUNTER — Ambulatory Visit: Payer: Medicaid Other

## 2019-10-05 NOTE — Progress Notes (Deleted)
    Patient, No Pcp Per   No chief complaint on file.   HPI:      [name] is a 29 y.o. G2P1011 whose LMP was No LMP recorded., presents today for ***  Hx of BV, neg STD testing 3/21  No past medical history on file.  Past Surgical History:  Procedure Laterality Date  . CESAREAN SECTION      No family history on file.  Social History   Socioeconomic History  . Marital status: Single    Spouse name: Not on file  . Number of children: Not on file  . Years of education: Not on file  . Highest education level: Not on file  Occupational History  . Not on file  Tobacco Use  . Smoking status: Current Every Day Smoker    Last attempt to quit: 05/19/2015    Years since quitting: 4.3  . Smokeless tobacco: Never Used  Vaping Use  . Vaping Use: Never used  Substance and Sexual Activity  . Alcohol use: Yes  . Drug use: Yes    Types: Marijuana    Comment: Occassionally  . Sexual activity: Yes    Birth control/protection: None  Other Topics Concern  . Not on file  Social History Narrative  . Not on file   Social Determinants of Health   Financial Resource Strain:   . Difficulty of Paying Living Expenses:   Food Insecurity:   . Worried About Programme researcher, broadcasting/film/video in the Last Year:   . Barista in the Last Year:   Transportation Needs:   . Freight forwarder (Medical):   Marland Kitchen Lack of Transportation (Non-Medical):   Physical Activity:   . Days of Exercise per Week:   . Minutes of Exercise per Session:   Stress:   . Feeling of Stress :   Social Connections:   . Frequency of Communication with Friends and Family:   . Frequency of Social Gatherings with Friends and Family:   . Attends Religious Services:   . Active Member of Clubs or Organizations:   . Attends Banker Meetings:   Marland Kitchen Marital Status:   Intimate Partner Violence:   . Fear of Current or Ex-Partner:   . Emotionally Abused:   Marland Kitchen Physically Abused:   . Sexually Abused:      Outpatient Medications Prior to Visit  Medication Sig Dispense Refill  . sulfamethoxazole-trimethoprim (BACTRIM DS) 800-160 MG tablet Take 1 tablet by mouth 2 (two) times daily. 20 tablet 0   No facility-administered medications prior to visit.      ROS:  Review of Systems BREAST: No symptoms   OBJECTIVE:   Vitals:  There were no vitals taken for this visit.  Physical Exam  Results: No results found for this or any previous visit (from the past 24 hour(s)).   Assessment/Plan: No diagnosis found.    No orders of the defined types were placed in this encounter.     No follow-ups on file.  Maeva Dant B. Sherron Mapp, PA-C 10/05/2019 8:21 PM

## 2019-10-06 ENCOUNTER — Ambulatory Visit: Payer: Medicaid Other | Admitting: Obstetrics and Gynecology

## 2019-11-11 ENCOUNTER — Ambulatory Visit: Payer: Medicaid Other | Admitting: Obstetrics and Gynecology

## 2019-12-19 DIAGNOSIS — R768 Other specified abnormal immunological findings in serum: Secondary | ICD-10-CM

## 2019-12-19 HISTORY — DX: Other specified abnormal immunological findings in serum: R76.8

## 2019-12-22 ENCOUNTER — Ambulatory Visit: Payer: Medicaid Other | Admitting: Obstetrics and Gynecology

## 2019-12-22 NOTE — Progress Notes (Signed)
Patient, No Pcp Per   Chief Complaint  Patient presents with  . STD testing  . Pelvic Pain    severe cramping on left side only right before cycle x months    HPI:      [name] is a 29 y.o. G2P1011 whose LMP was Patient's last menstrual period was 11/28/2019 (exact date)., presents today for RLQ pain the week before her period the past few cycles. Pain is throbbing and sometimes sharp, waking her from her sleep. Pain resolves with menses. Ibup and heating pad help some. No non-menstrual pain. No hx of ovar cysts. No GI sx, fevers with pain. No urin sx. She is sex active, no new partners, no dyspareunia/bleeding. Uses condoms sometimes. Hx of chlamydia in past. No vag sx, no known exposures. Neg STD testing 3/21 Hx of BV 3/21 with tx, no recent sx.  Past Medical History:  Diagnosis Date  . Chlamydia     Past Surgical History:  Procedure Laterality Date  . CESAREAN SECTION      Family History  Problem Relation Age of Onset  . Thyroid cancer Maternal Grandmother     Social History   Socioeconomic History  . Marital status: Single    Spouse name: Not on file  . Number of children: Not on file  . Years of education: Not on file  . Highest education level: Not on file  Occupational History  . Not on file  Tobacco Use  . Smoking status: Current Every Day Smoker    Last attempt to quit: 05/19/2015    Years since quitting: 4.6  . Smokeless tobacco: Never Used  Vaping Use  . Vaping Use: Never used  Substance and Sexual Activity  . Alcohol use: Yes  . Drug use: Yes    Types: Marijuana    Comment: Occassionally  . Sexual activity: Yes    Birth control/protection: None  Other Topics Concern  . Not on file  Social History Narrative  . Not on file   Social Determinants of Health   Financial Resource Strain:   . Difficulty of Paying Living Expenses: Not on file  Food Insecurity:   . Worried About Programme researcher, broadcasting/film/video in the Last Year: Not on file  . Ran  Out of Food in the Last Year: Not on file  Transportation Needs:   . Lack of Transportation (Medical): Not on file  . Lack of Transportation (Non-Medical): Not on file  Physical Activity:   . Days of Exercise per Week: Not on file  . Minutes of Exercise per Session: Not on file  Stress:   . Feeling of Stress : Not on file  Social Connections:   . Frequency of Communication with Friends and Family: Not on file  . Frequency of Social Gatherings with Friends and Family: Not on file  . Attends Religious Services: Not on file  . Active Member of Clubs or Organizations: Not on file  . Attends Banker Meetings: Not on file  . Marital Status: Not on file  Intimate Partner Violence:   . Fear of Current or Ex-Partner: Not on file  . Emotionally Abused: Not on file  . Physically Abused: Not on file  . Sexually Abused: Not on file    Outpatient Medications Prior to Visit  Medication Sig Dispense Refill  . sulfamethoxazole-trimethoprim (BACTRIM DS) 800-160 MG tablet Take 1 tablet by mouth 2 (two) times daily. 20 tablet 0   No facility-administered medications prior  to visit.      ROS:  Review of Systems  Constitutional: Negative for fever.  Gastrointestinal: Negative for blood in stool, constipation, diarrhea, nausea and vomiting.  Genitourinary: Positive for pelvic pain. Negative for dyspareunia, dysuria, flank pain, frequency, hematuria, urgency, vaginal bleeding, vaginal discharge and vaginal pain.  Musculoskeletal: Negative for back pain.  Skin: Negative for rash.    OBJECTIVE:   Vitals:  BP 90/64   Ht 5\' 2"  (1.575 m)   Wt 125 lb (56.7 kg)   LMP 11/28/2019 (Exact Date)   BMI 22.86 kg/m   Physical Exam Vitals reviewed.  Constitutional:      Appearance: She is well-developed.  Pulmonary:     Effort: Pulmonary effort is normal.  Genitourinary:    General: Normal vulva.     Pubic Area: No rash.      Labia:        Right: No rash, tenderness or lesion.          Left: No rash, tenderness or lesion.      Vagina: Normal. No vaginal discharge, erythema or tenderness.     Cervix: Normal.     Uterus: Normal. Not enlarged and not tender.      Adnexa: Right adnexa normal and left adnexa normal.       Right: No mass or tenderness.         Left: No mass or tenderness.    Musculoskeletal:        General: Normal range of motion.     Cervical back: Normal range of motion.  Skin:    General: Skin is warm and dry.  Neurological:     General: No focal deficit present.     Mental Status: She is alert and oriented to person, place, and time.  Psychiatric:        Mood and Affect: Mood normal.        Behavior: Behavior normal.        Thought Content: Thought content normal.        Judgment: Judgment normal.    Assessment/Plan: RLQ abdominal pain - Plan: Cervicovaginal ancillary only; Neg exam. Sx before menses. Rule out STDs. If neg, RTO with sx for GYN u/s. Pt to call to sched prn. Cont ibup/heating pad prn.  Screening for STD (sexually transmitted disease) - Plan: Cervicovaginal ancillary only, HIV Antibody (routine testing w rflx), RPR, HSV 2 antibody, IgG, Hepatitis C antibody    Return if symptoms worsen or fail to improve.  Shelly Mashaw B. Phala Schraeder, PA-C 12/23/2019 11:11 AM

## 2019-12-22 NOTE — Patient Instructions (Signed)
I value your feedback and entrusting us with your care. If you get a [facility] patient survey, I would appreciate you taking the time to let us know about your experience today. Thank you!  As of February 27, 2019, your lab results will be released to your MyChart immediately, before I even have a chance to see them. Please give me time to review them and contact you if there are any abnormalities. Thank you for your patience.  

## 2019-12-23 ENCOUNTER — Encounter: Payer: Self-pay | Admitting: Obstetrics and Gynecology

## 2019-12-23 ENCOUNTER — Other Ambulatory Visit (HOSPITAL_COMMUNITY)
Admission: RE | Admit: 2019-12-23 | Discharge: 2019-12-23 | Disposition: A | Discharge status: Home / Self Care | Payer: Medicaid Other | Source: Ambulatory Visit | Attending: Obstetrics and Gynecology | Admitting: Obstetrics and Gynecology

## 2019-12-23 ENCOUNTER — Ambulatory Visit (INDEPENDENT_AMBULATORY_CARE_PROVIDER_SITE_OTHER): Payer: Medicaid Other | Admitting: Obstetrics and Gynecology

## 2019-12-23 ENCOUNTER — Other Ambulatory Visit: Payer: Self-pay

## 2019-12-23 VITALS — BP 90/64 | Ht 62.0 in | Wt 125.0 lb

## 2019-12-23 DIAGNOSIS — R1031 Right lower quadrant pain: Secondary | ICD-10-CM | POA: Diagnosis present

## 2019-12-23 DIAGNOSIS — Z113 Encounter for screening for infections with a predominantly sexual mode of transmission: Secondary | ICD-10-CM

## 2019-12-24 LAB — RPR: RPR Ser Ql: NONREACTIVE

## 2019-12-24 LAB — HIV ANTIBODY (ROUTINE TESTING W REFLEX): HIV Screen 4th Generation wRfx: NONREACTIVE

## 2019-12-24 LAB — HSV 2 ANTIBODY, IGG: HSV 2 IgG, Type Spec: 11.5 index — ABNORMAL HIGH (ref 0.00–0.90)

## 2019-12-24 LAB — HEPATITIS C ANTIBODY: Hep C Virus Ab: 0.1 s/co ratio (ref 0.0–0.9)

## 2019-12-25 ENCOUNTER — Encounter: Payer: Self-pay | Admitting: Obstetrics and Gynecology

## 2019-12-25 LAB — CERVICOVAGINAL ANCILLARY ONLY
Chlamydia: NEGATIVE
Comment: NEGATIVE
Comment: NEGATIVE
Comment: NORMAL
Neisseria Gonorrhea: NEGATIVE
Trichomonas: NEGATIVE

## 2020-03-26 ENCOUNTER — Ambulatory Visit
Admission: EM | Admit: 2020-03-26 | Discharge: 2020-03-26 | Disposition: A | Discharge status: Home / Self Care | Payer: Medicaid Other | Attending: Family Medicine | Admitting: Family Medicine

## 2020-03-26 DIAGNOSIS — B349 Viral infection, unspecified: Secondary | ICD-10-CM

## 2020-03-26 DIAGNOSIS — Z1152 Encounter for screening for COVID-19: Secondary | ICD-10-CM

## 2020-03-26 NOTE — ED Triage Notes (Signed)
Pt c/o of body aches, chills, and headaches x 2 days. Had a positive exposure last week.   Denies fever.

## 2020-03-26 NOTE — ED Provider Notes (Signed)
Renaldo Fiddler    CSN: [id number] Arrival date & time: 03/26/20  1155      History   Chief Complaint Chief Complaint  Patient presents with  . Fever  . Chills  . Generalized Body Aches    HPI Shelly Hamilton is a 30 y.o. female.   Patient is a 30 year old female presents today for fever, body aches, chills, headache for 2 days.  Had positive Covid exposure last week.     Past Medical History:  Diagnosis Date  . Chlamydia   . 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   Past Surgical History:  Procedure Laterality Date  . CESAREAN SECTION      OB History    Gravida  2   Para  1   Term  1   Preterm      AB  1   Living  1     SAB      IAB  1   Ectopic      Multiple      Live Births  1            Home Medications    Prior to Admission medications   Not on File    Family History Family History  Problem Relation Age of Onset  . Thyroid cancer Maternal Grandmother     Social History Social History   Tobacco Use  . Smoking status: Current Every Day Smoker    Last attempt to quit: 05/19/2015    Years since quitting: 4.8  . Smokeless tobacco: Never Used  Vaping Use  . Vaping Use: Never used  Substance Use Topics  . Alcohol use: Yes  . Drug use: Yes    Types: Marijuana    Comment: Occassionally     Allergies   Patient has no known allergies.   Review of Systems Review of Systems   Physical Exam Triage Vital Signs ED Triage Vitals  Enc Vitals Group     BP 03/26/20 1217 129/84     Pulse Rate 03/26/20 1217 83     Resp 03/26/20 1217 16     Temp 03/26/20 1217 98 F (36.7 C)     Temp Source 03/26/20 1217 Temporal     SpO2 03/26/20 1217 98 %     Weight --      Height --      Head Circumference --      Peak Flow --      Pain Score 03/26/20 1219 0     Pain Loc --      Pain Edu? --      Excl. in GC? --    No data found.  Updated Vital Signs BP 129/84 (BP Location: Left  Arm)   Pulse 83   Temp 98 F (36.7 C) (Temporal)   Resp 16   LMP  (Within Weeks) Comment: end of dec   SpO2 98%   Visual Acuity Right Eye Distance:   Left Eye Distance:   Bilateral Distance:    Right Eye Near:   Left Eye Near:    Bilateral Near:     Physical Exam Vitals and nursing note reviewed.  Constitutional:      General: She is not in acute distress.    Appearance: Normal appearance. She is not ill-appearing, toxic-appearing or diaphoretic.  HENT:     Head: Normocephalic.  Eyes:     Conjunctiva/sclera: Conjunctivae normal.  Pulmonary:  Effort: Pulmonary effort is normal.  Musculoskeletal:        General: Normal range of motion.     Cervical back: Normal range of motion.  Skin:    General: Skin is warm and dry.     Findings: No rash.  Neurological:     Mental Status: She is alert.  Psychiatric:        Mood and Affect: Mood normal.      UC Treatments / Results  Labs (all labs ordered are listed, but only abnormal results are displayed) Labs Reviewed  NOVEL CORONAVIRUS, NAA    EKG   Radiology No results found.  Procedures Procedures (including critical care time)  Medications Ordered in UC Medications - No data to display  Initial Impression / Assessment and Plan / UC Course  I have reviewed the triage vital signs and the nursing notes.  Pertinent labs & imaging results that were available during my care of the patient were reviewed by me and considered in my medical decision making (see chart for details).     Viral illness with Covid exposure Covid test pending.  Recommend quarantining and over-the-counter medicines as needed. Follow up as needed for continued or worsening symptoms  Final Clinical Impressions(s) / UC Diagnoses   Final diagnoses:  Viral illness     Discharge Instructions     We have tested you for COVID  Go home and quarantine until we get our results.  Work note given You can take OTC medications as needed.      ED Prescriptions    None     PDMP not reviewed this encounter.   Janace Aris, NP 03/26/20 1317

## 2020-03-26 NOTE — Discharge Instructions (Addendum)
We have tested you for COVID  °Go home and quarantine until we get our results.  °Work note given °You can take OTC medications as needed.  ° °

## 2020-03-29 LAB — NOVEL CORONAVIRUS, NAA: SARS-CoV-2, NAA: NOT DETECTED

## 2020-03-29 LAB — SARS-COV-2, NAA 2 DAY TAT

## 2020-07-05 ENCOUNTER — Ambulatory Visit: Payer: Medicaid Other | Admitting: Advanced Practice Midwife

## 2020-09-08 ENCOUNTER — Other Ambulatory Visit (HOSPITAL_COMMUNITY)
Admission: RE | Admit: 2020-09-08 | Discharge: 2020-09-08 | Disposition: A | Discharge status: Home / Self Care | Payer: Medicaid Other | Source: Ambulatory Visit | Attending: Advanced Practice Midwife | Admitting: Advanced Practice Midwife

## 2020-09-08 ENCOUNTER — Ambulatory Visit (INDEPENDENT_AMBULATORY_CARE_PROVIDER_SITE_OTHER): Payer: Medicaid Other | Admitting: Advanced Practice Midwife

## 2020-09-08 ENCOUNTER — Ambulatory Visit: Payer: Medicaid Other | Admitting: Advanced Practice Midwife

## 2020-09-08 ENCOUNTER — Encounter: Payer: Self-pay | Admitting: Advanced Practice Midwife

## 2020-09-08 ENCOUNTER — Other Ambulatory Visit: Payer: Self-pay

## 2020-09-08 VITALS — BP 108/70 | HR 78 | Ht 62.0 in | Wt 130.0 lb

## 2020-09-08 DIAGNOSIS — Z8742 Personal history of other diseases of the female genital tract: Secondary | ICD-10-CM | POA: Diagnosis not present

## 2020-09-08 DIAGNOSIS — Z01419 Encounter for gynecological examination (general) (routine) without abnormal findings: Secondary | ICD-10-CM

## 2020-09-08 DIAGNOSIS — Z113 Encounter for screening for infections with a predominantly sexual mode of transmission: Secondary | ICD-10-CM

## 2020-09-08 DIAGNOSIS — Z124 Encounter for screening for malignant neoplasm of cervix: Secondary | ICD-10-CM | POA: Insufficient documentation

## 2020-09-09 LAB — CERVICOVAGINAL ANCILLARY ONLY
Bacterial Vaginitis (gardnerella): POSITIVE — AB
Candida Glabrata: NEGATIVE
Candida Vaginitis: POSITIVE — AB
Comment: NEGATIVE
Comment: NEGATIVE
Comment: NEGATIVE

## 2020-09-09 NOTE — Progress Notes (Signed)
Gynecology Annual Exam   Date of Service: 09/08/2020  PCP: Shelly Hamilton, No Pcp Per (Inactive)  Chief Complaint:  Chief Complaint  Shelly Hamilton presents with   Annual Exam    History of Present Illness: Shelly Hamilton is a 30 y.o. G2P1011 presents for annual exam. The Shelly Hamilton has complaint today of ongoing right lower quadrant pain described as crampy and numb that occurs monthly 2 days before her period. She was seen for the same pain 8 months ago. At that time she was advised to schedule a work in visit when she is having the pain. She denies any pain today. She has no other concerns.  LMP: Shelly Hamilton's last menstrual period was 08/23/2020. Average Interval: regular, 28 days Duration of flow:  3-4  days Heavy Menses: no Clots: no Intermenstrual Bleeding: no Postcoital Bleeding: no Dysmenorrhea: yes  The Shelly Hamilton is sexually active. She currently uses condoms for contraception. She denies dyspareunia.  The Shelly Hamilton  does occasionally  perform self breast exams.  There is no notable family history of breast or ovarian cancer in her family.  The Shelly Hamilton wears seatbelts: yes.   The Shelly Hamilton has regular exercise:  she walks and swims .  She admits a healthy diet and adequate hydration with water. She also drinks 1 or 2 sodas daily. She admits adequate sleep.  The Shelly Hamilton denies current symptoms of depression.    Review of Systems: Review of Systems  Constitutional:  Negative for chills and fever.  HENT:  Negative for congestion, ear discharge, ear pain, hearing loss, sinus pain and sore throat.   Eyes:  Negative for blurred vision and double vision.  Respiratory:  Negative for cough, shortness of breath and wheezing.   Cardiovascular:  Negative for chest pain, palpitations and leg swelling.  Gastrointestinal:  Negative for abdominal pain, blood in stool, constipation, diarrhea, heartburn, melena, nausea and vomiting.  Genitourinary:  Negative for dysuria, flank pain, frequency, hematuria and urgency.        Positive for RLQ pain  Musculoskeletal:  Negative for back pain, joint pain and myalgias.  Skin:  Negative for itching and rash.  Neurological:  Negative for dizziness, tingling, tremors, sensory change, speech change, focal weakness, seizures, loss of consciousness, weakness and headaches.  Endo/Heme/Allergies:  Negative for environmental allergies. Does not bruise/bleed easily.  Psychiatric/Behavioral:  Negative for depression, hallucinations, memory loss, substance abuse and suicidal ideas. The Shelly Hamilton is not nervous/anxious and does not have insomnia.    Past Medical History:  There are no problems to display for this Shelly Hamilton.   Past Surgical History:  Past Surgical History:  Procedure Laterality Date   CESAREAN SECTION      Gynecologic History:  Shelly Hamilton's last menstrual period was 08/23/2020. Contraception: condoms Last Pap: Results were: NIL and HR HPV+   Obstetric History: G2P1011  Family History:  Family History  Problem Relation Age of Onset   Thyroid cancer Maternal Grandmother     Social History:  Social History   Socioeconomic History   Marital status: Single    Spouse name: Not on file   Number of children: Not on file   Years of education: Not on file   Highest education level: Not on file  Occupational History   Not on file  Tobacco Use   Smoking status: Former    Pack years: 0.00    Types: Cigarettes    Quit date: 05/19/2015    Years since quitting: 5.3   Smokeless tobacco: Never  Vaping Use   Vaping  Use: Every day   Substances: Nicotine  Substance and Sexual Activity   Alcohol use: Yes   Drug use: Yes    Types: Marijuana    Comment: Occassionally   Sexual activity: Yes    Birth control/protection: None  Other Topics Concern   Not on file  Social History Narrative   Not on file   Social Determinants of Health   Financial Resource Strain: Not on file  Food Insecurity: Not on file  Transportation Needs: Not on file  Physical  Activity: Not on file  Stress: Not on file  Social Connections: Not on file  Intimate Partner Violence: Not on file    Allergies:  No Known Allergies  Medications: Prior to Admission medications   Not on File    Physical Exam Vitals: Blood pressure 108/70, pulse 78, height 5\' 2"  (1.575 m), weight 130 lb (59 kg), last menstrual period 08/23/2020.  General: NAD HEENT: normocephalic, anicteric Thyroid: no enlargement, no palpable nodules Pulmonary: No increased work of breathing, CTAB Cardiovascular: RRR, distal pulses 2+ Breast: Breast symmetrical, no tenderness, no palpable nodules or masses, no skin or nipple retraction present, no nipple discharge.  No axillary or supraclavicular lymphadenopathy. Abdomen: NABS, soft, non-tender, non-distended.  Umbilicus without lesions.  No hepatomegaly, splenomegaly or masses palpable. No evidence of hernia  Genitourinary:  External: Normal external female genitalia.  Normal urethral meatus, normal Bartholin's and Skene's glands.    Vagina: Normal vaginal mucosa, no evidence of prolapse.    Cervix: Grossly normal in appearance, no bleeding, no CMT  Uterus: Non-enlarged, mobile, normal contour.    Adnexa: ovaries non-enlarged, no adnexal masses  Rectal: deferred  Lymphatic: no evidence of inguinal lymphadenopathy Extremities: no edema, erythema, or tenderness Neurologic: Grossly intact Psychiatric: mood appropriate, affect full   Assessment: 30 y.o. G2P1011 routine annual exam  Plan: Problem List Items Addressed This Visit   None Visit Diagnoses     Well woman exam with routine gynecological exam    -  Primary   Relevant Orders   Cytology - PAP   Cervicovaginal ancillary only   Screen for sexually transmitted diseases       Relevant Orders   Cytology - PAP   Screening for cervical cancer       Relevant Orders   Cytology - PAP   Hx of vaginitis       Relevant Orders   Cervicovaginal ancillary only   Hx of abnormal cervical  Pap smear       Relevant Orders   Cytology - PAP       1) STI screening  was offered and accepted  2)  ASCCP guidelines and rationale discussed.  Shelly Hamilton opts for every 3 years screening interval following normal screen  3) Contraception - the Shelly Hamilton is currently using  condoms.  She is happy with her current form of contraception and plans to continue  4) Routine healthcare maintenance including cholesterol, diabetes screening discussed Declines  5) Return in about 1 year (around 09/08/2021) for annual established gyn and as needed for RLQ pain.   09/10/2021, CNM [facility] 09/09/2020, 9:35 AM

## 2020-09-10 ENCOUNTER — Other Ambulatory Visit: Payer: Self-pay | Admitting: Advanced Practice Midwife

## 2020-09-10 DIAGNOSIS — B373 Candidiasis of vulva and vagina: Secondary | ICD-10-CM

## 2020-09-10 DIAGNOSIS — B3731 Acute candidiasis of vulva and vagina: Secondary | ICD-10-CM

## 2020-09-10 DIAGNOSIS — B9689 Other specified bacterial agents as the cause of diseases classified elsewhere: Secondary | ICD-10-CM

## 2020-09-10 LAB — CYTOLOGY - PAP
Chlamydia: NEGATIVE
Comment: NEGATIVE
Comment: NEGATIVE
Comment: NEGATIVE
Comment: NORMAL
Diagnosis: NEGATIVE
High risk HPV: NEGATIVE
Neisseria Gonorrhea: NEGATIVE
Trichomonas: NEGATIVE

## 2020-09-10 MED ORDER — METRONIDAZOLE 0.75 % VA GEL: 1.0000 | Freq: Every day | VAGINAL | 1 refills | Status: AC

## 2020-09-10 MED ORDER — FLUCONAZOLE 150 MG PO TABS: 150.0000 mg | ORAL_TABLET | Freq: Once | ORAL | 3 refills | Status: AC

## 2020-09-10 NOTE — Progress Notes (Signed)
Rx's metro gel and diflucan sent to treat bv and yeast. Message to patient regarding same along with comfort/preventive measures.

## 2020-10-10 IMAGING — CT CT CHEST WITH CONTRAST
2 of 5 series · 12 of 36 positions shown, 15 images · IV contrast (omnipaque)
Comparison: Chest radiograph dated 08/21/2018

CLINICAL DATA: 28-year-old female with blunt abdominal trauma.

EXAM:
CT CHEST, ABDOMEN, AND PELVIS WITH CONTRAST
TECHNIQUE: Multidetector CT imaging of the chest, abdomen and pelvis was
performed following the standard protocol during bolus
administration of intravenous contrast.
CONTRAST:  100mL OMNIPAQUE IOHEXOL 300 MG/ML  SOLN

[Series 2: cap with · axial · 0.62mm/px · z∈[-734,-259]mm · 9 of 119 slices shown, 12 images]
[im 12/119  mediastinal]
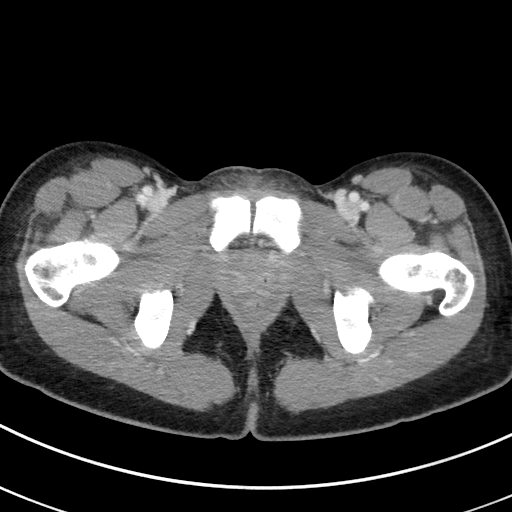
[im 12/119  lung]
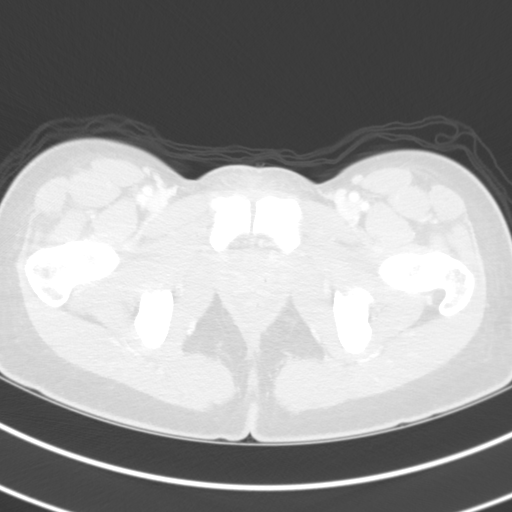
[im 24/119  lung]
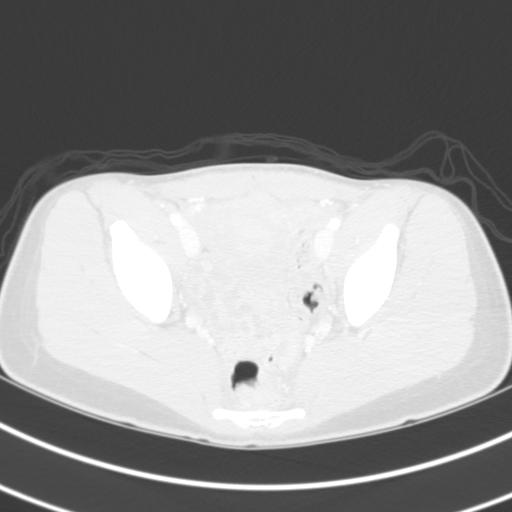
[im 36/119  lung]
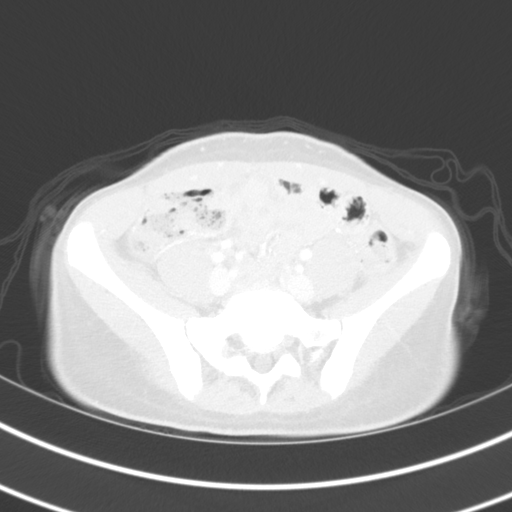
[im 48/119  lung]
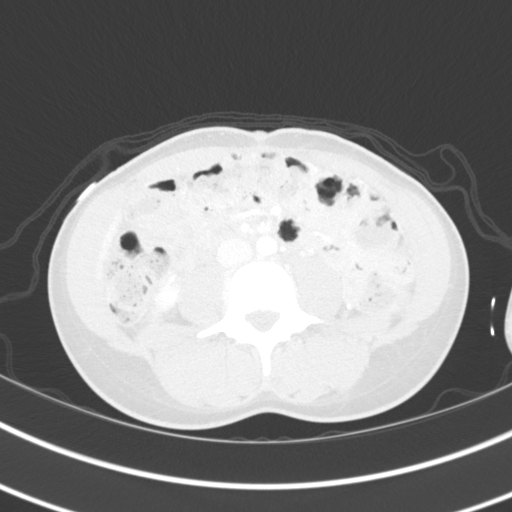
[im 60/119  mediastinal]
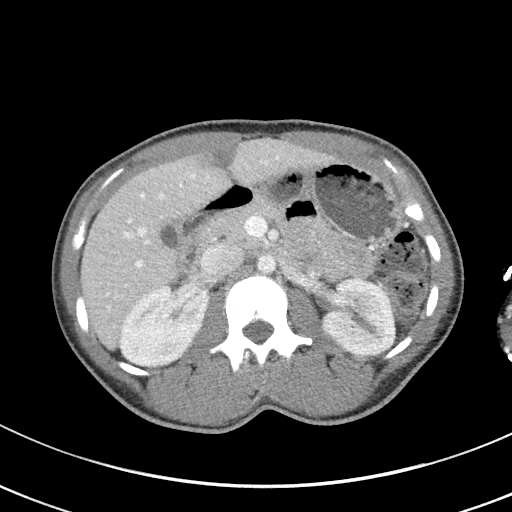
[im 60/119  lung]
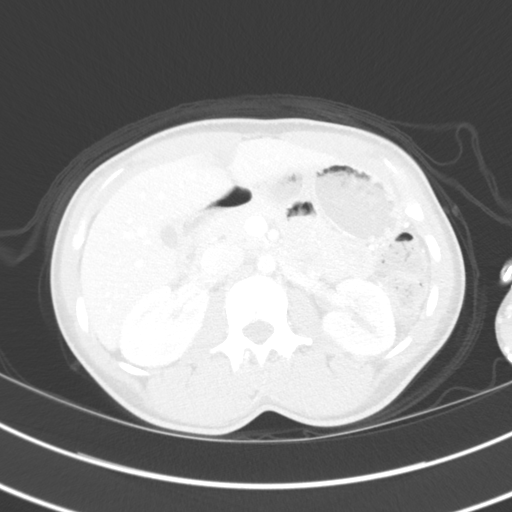
[im 71/119  lung]
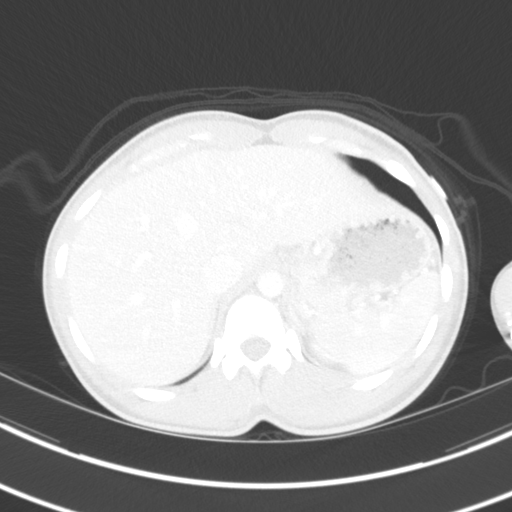
[im 83/119  lung]
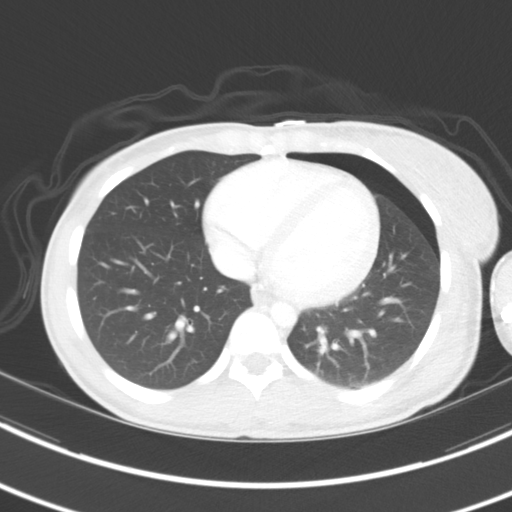
[im 95/119  lung]
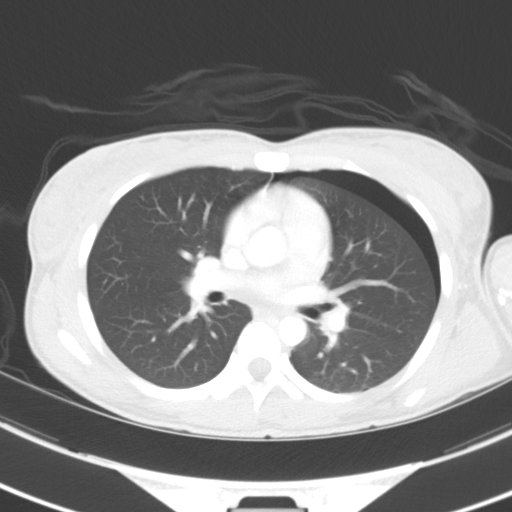
[im 107/119  mediastinal]
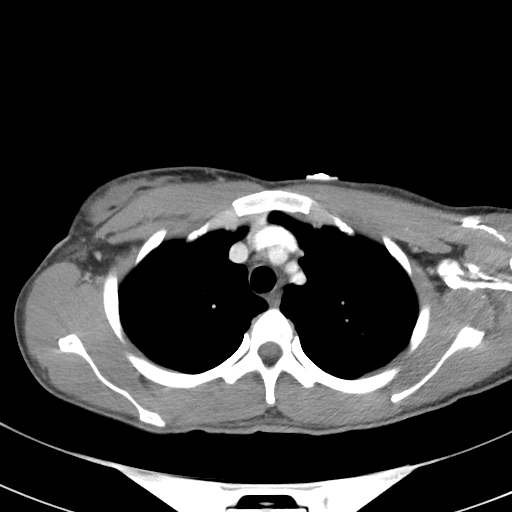
[im 107/119  lung]
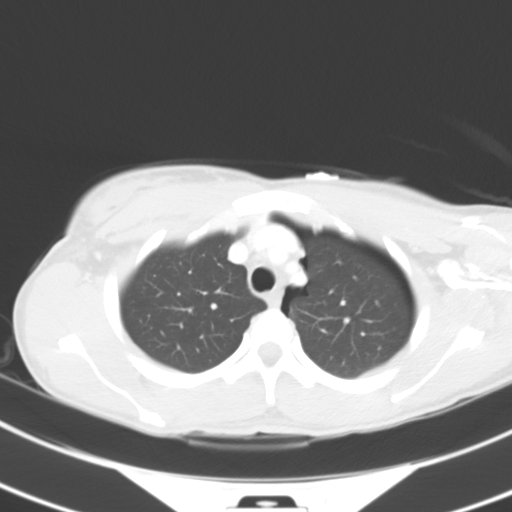

[Series 5: coronals · coronal · 0.64mm/px · 3 of 100 slices shown]
[im 20/100  lung]
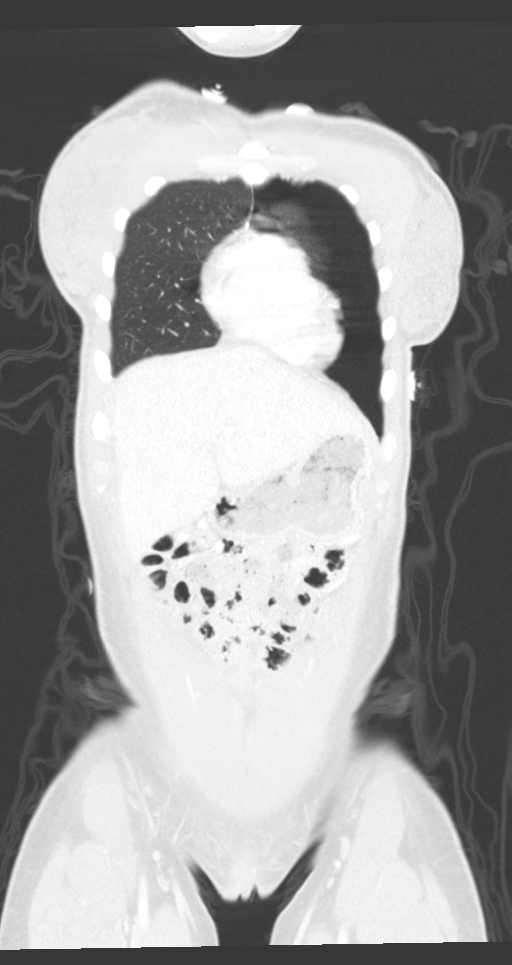
[im 40/100  lung]
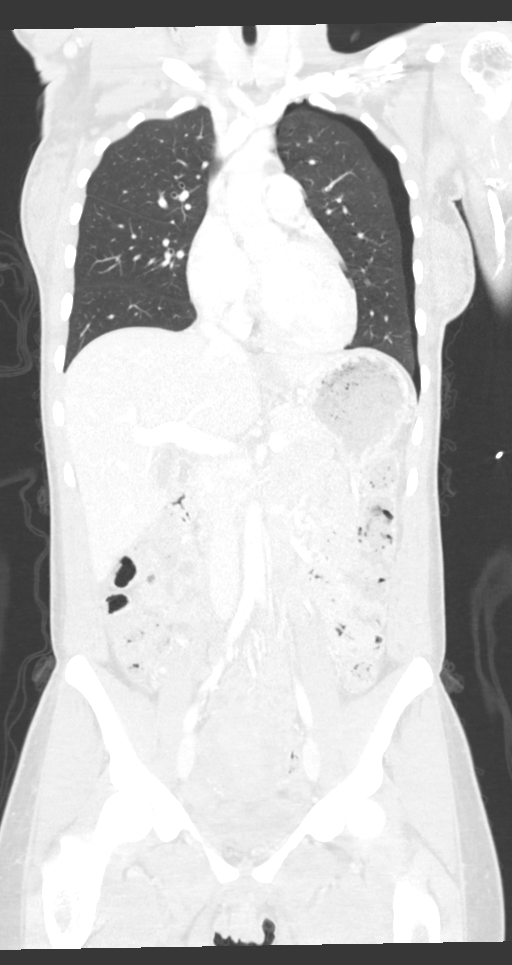
[im 60/100  lung]
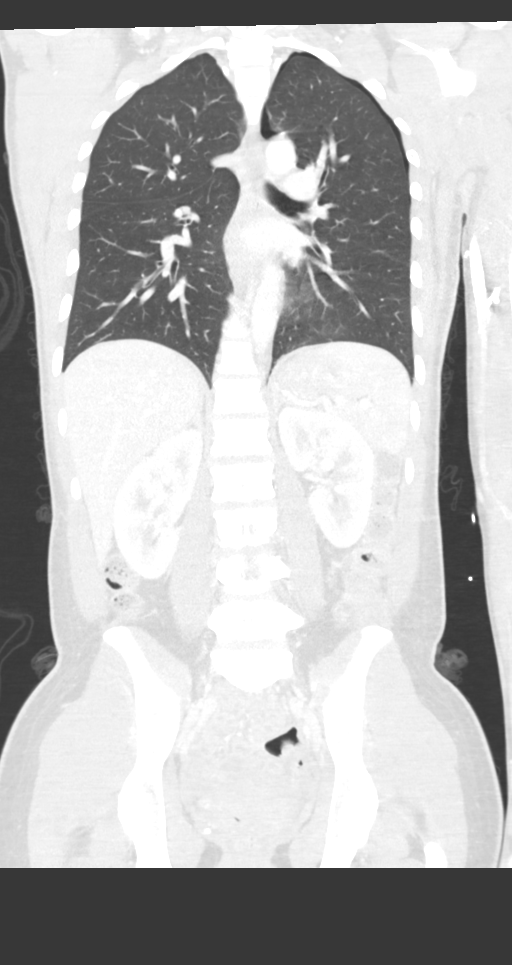

[12 of 36 positions shown; findings below may reference images not displayed]

FINDINGS: CT CHEST FINDINGS

Cardiovascular: There is no cardiomegaly or pericardial effusion.
The thoracic aorta is unremarkable. The origins of the great vessels
of the aortic arch are patent. The central pulmonary arteries are
unremarkable.

Mediastinum/Nodes: There is no hilar or mediastinal adenopathy. The
esophagus is grossly unremarkable. There is a 1.7 cm exophytic
nodule from the posterior left thyroid lobe versus a parathyroid
adenoma. Ultrasound may provide better evaluation. No mediastinal
fluid collection or hematoma.

Lungs/Pleura: There is a left-sided pneumothorax measuring up to 15
mm to the anterior pleural surface. Minimal left lung base
atelectatic changes noted. No focal consolidation or pleural
effusion. The central airways are patent. Small nodular density
within the trachea (series 4, image 34) likely represents mucus
secretions, although an endotracheal nodule is not excluded.

Musculoskeletal: There is a faint lucency through the left T2
transverse process (series 4, image 13 and sagittal series 6 image
77 and 78) most consistent with a nondisplaced fracture. There is
probable nondisplaced fracture of the left second rib (series 6,
image 100). Additional occult rib fractures are not excluded. No
displaced fracture identified.

CT ABDOMEN PELVIS FINDINGS

No intra-abdominal free air or free fluid.

Hepatobiliary: The liver is unremarkable. Focal hypodensity adjacent
to the falciform ligament most consistent with an area of fatty
infiltration or differential perfusion. No intrahepatic biliary
ductal dilatation. The gallbladder is unremarkable.

Pancreas: Unremarkable. No pancreatic ductal dilatation or
surrounding inflammatory changes.

Spleen: Normal in size without focal abnormality.

Adrenals/Urinary Tract: Adrenal glands are unremarkable. Kidneys are
normal, without renal calculi, focal lesion, or hydronephrosis.
Bladder is unremarkable.

Stomach/Bowel: There is no bowel obstruction or active inflammation.
Normal appendix.

Vascular/Lymphatic: The abdominal aorta and IVC appear unremarkable.
No portal venous gas. There is no adenopathy.

Reproductive: The uterus is anteverted and grossly unremarkable.
Mildly enlarged ovaries in the anterior pelvis indenting the urinary
bladder. There is a 1.5 cm dominant follicle or corpus luteum on the
right side.

Other: None

Musculoskeletal: No acute or significant osseous findings.
IMPRESSION: 1. Left-sided pneumothorax. This finding was seen on the earlier
radiograph.
2. Nondisplaced fracture of the left T2 transverse process as well
as probable nondisplaced fracture of the left second rib. No
displaced fracture identified.
3. No acute/traumatic intra-abdominal or pelvic pathology.
4. A 1.7 cm exophytic left thyroid nodule versus a parathyroid
adenoma.

## 2020-10-10 IMAGING — CT CT HEAD WITHOUT CONTRAST
4 of 7 series · 16 of 47 positions shown, 17 images · non-contrast
Comparison: None.

CLINICAL DATA: [employer] accident

EXAM:
CT HEAD WITHOUT CONTRAST
CT CERVICAL SPINE WITHOUT CONTRAST
TECHNIQUE: Multidetector CT imaging of the head and cervical spine was
performed following the standard protocol without intravenous
contrast. Multiplanar CT image reconstructions of the cervical spine
were also generated.

[Series 3: head wo · axial · 0.40mm/px · z∈[-85,-15]mm · 3 of 29 slices shown, 4 images]
[im 8/29  brain]
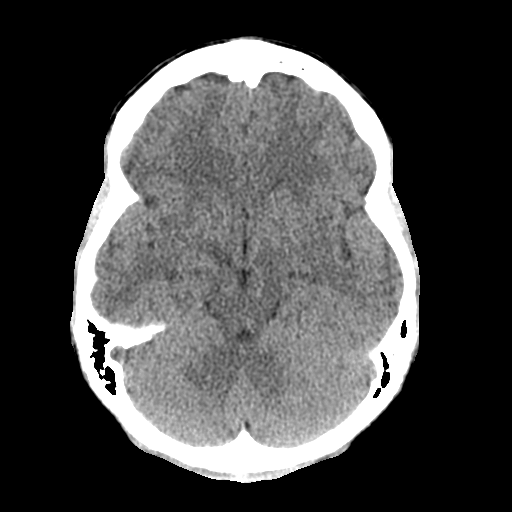
[im 8/29  bone]
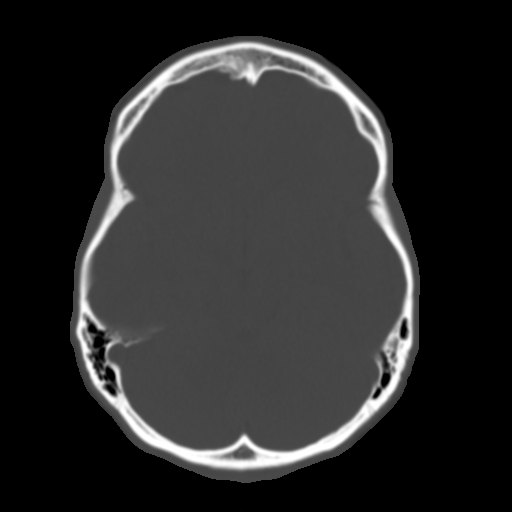
[im 15/29  brain]
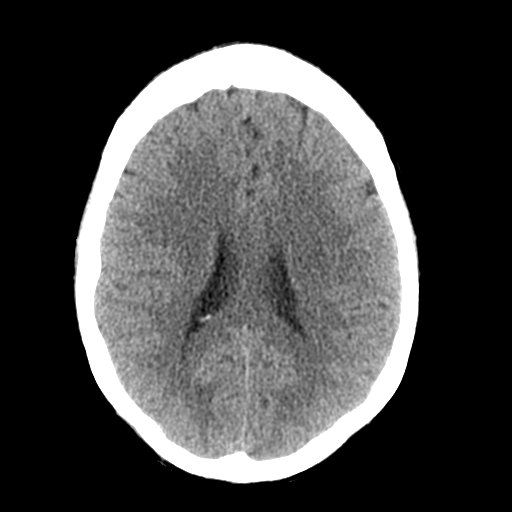
[im 22/29  brain]
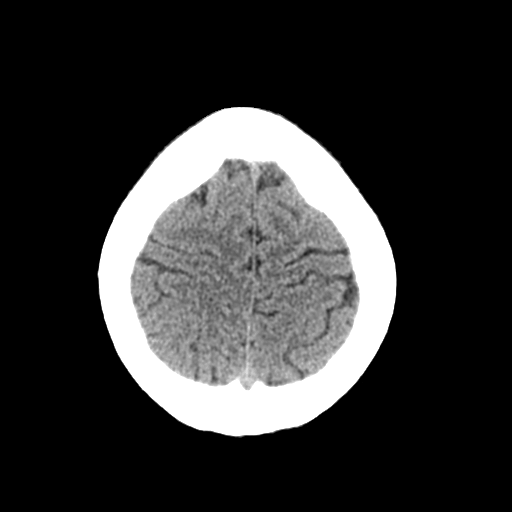

[Series 6: coronal soft tissue · coronal · 0.31mm/px · 3 of 62 slices shown]
[im 6/62  brain]
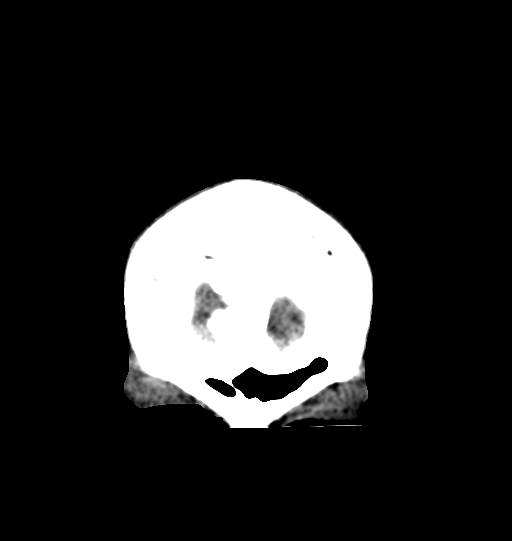
[im 12/62  brain]
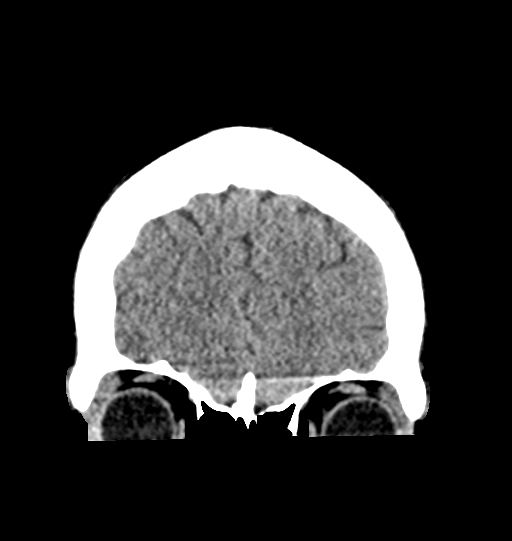
[im 18/62  brain]
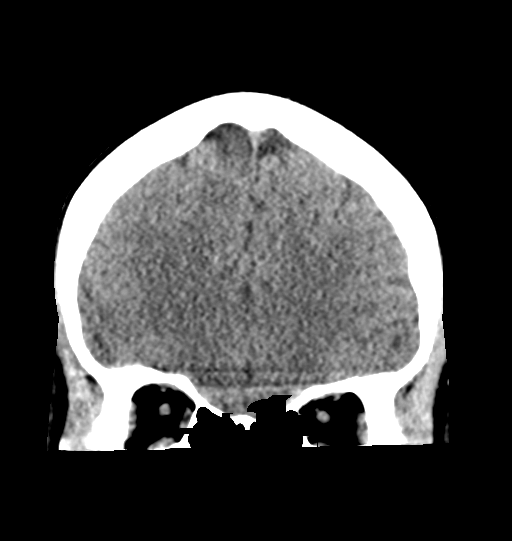

[Series 7: sagittal soft tissue · sagittal · 0.32mm/px · 2 of 48 slices shown]
[im 16/48  brain]
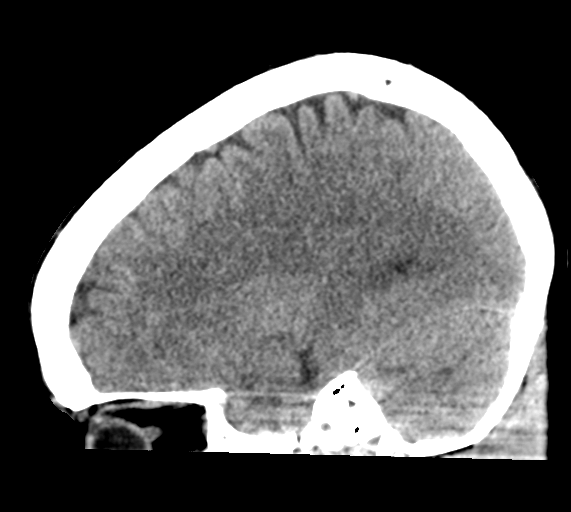
[im 32/48  brain]
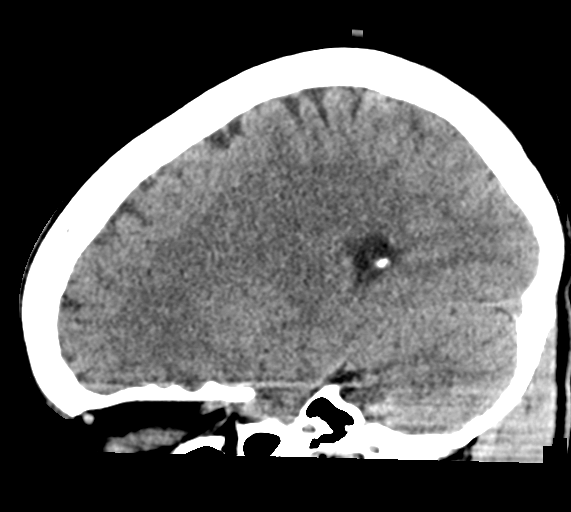

[Series 10: orthogonal bone · axial · 0.19mm/px · z∈[-244,-132]mm · 8 of 77 slices shown]
[im 7/77  bone]
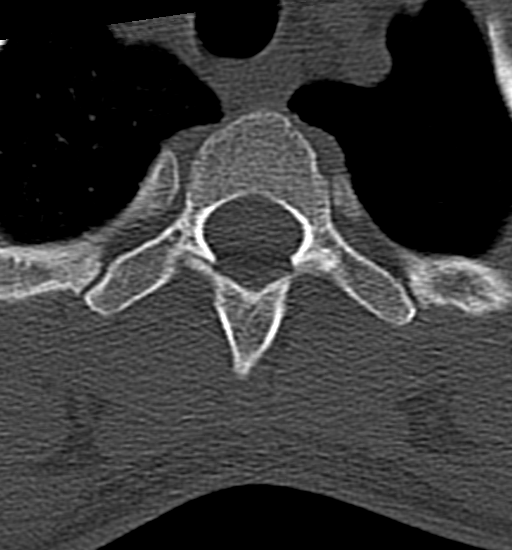
[im 14/77  bone]
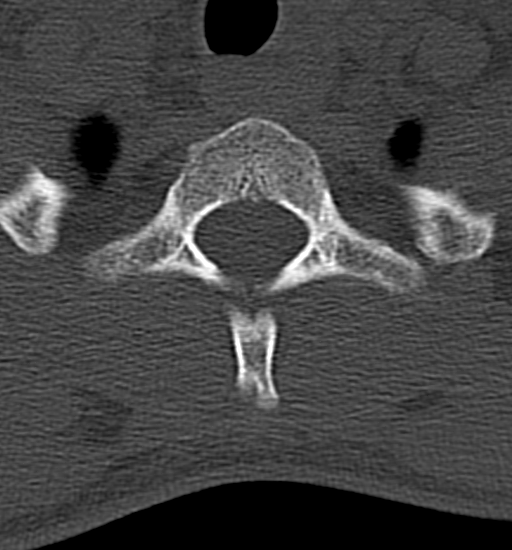
[im 28/77  bone]
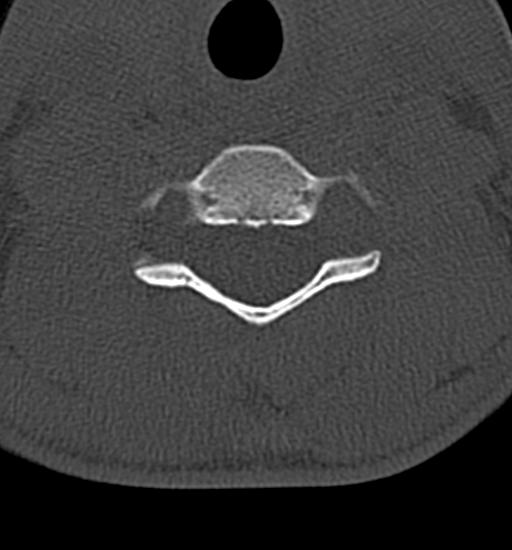
[im 35/77  bone]
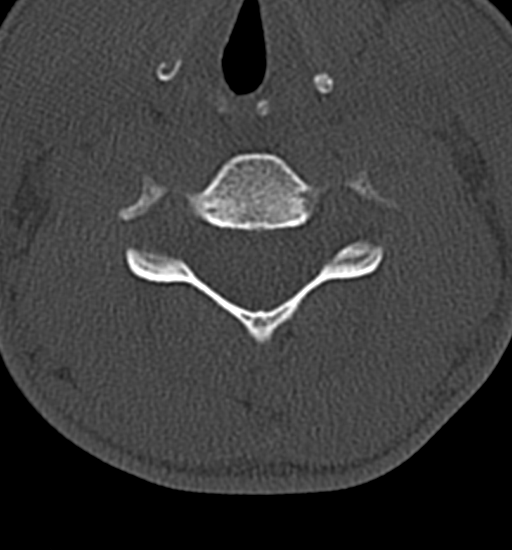
[im 42/77  bone]
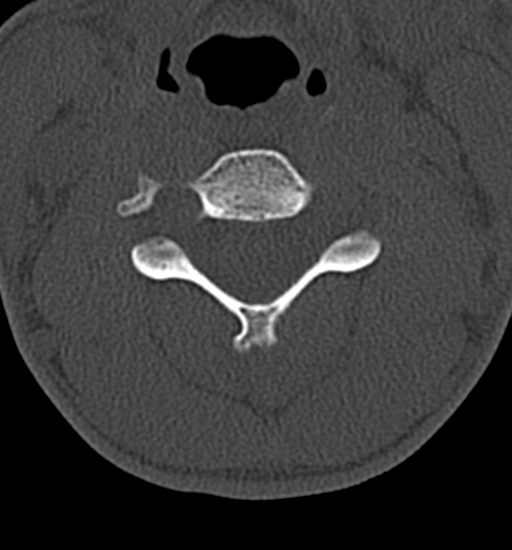
[im 49/77  bone]
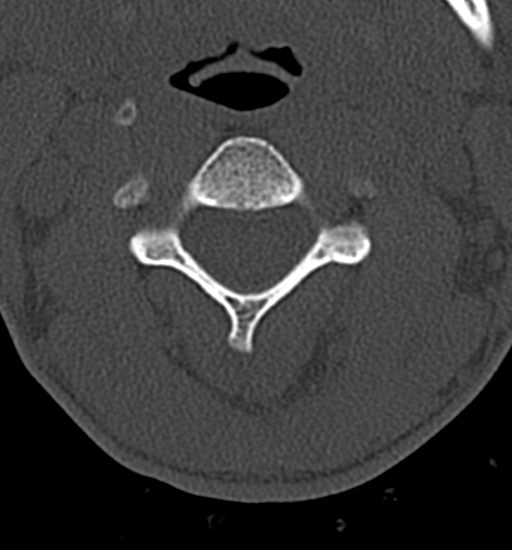
[im 63/77  bone]
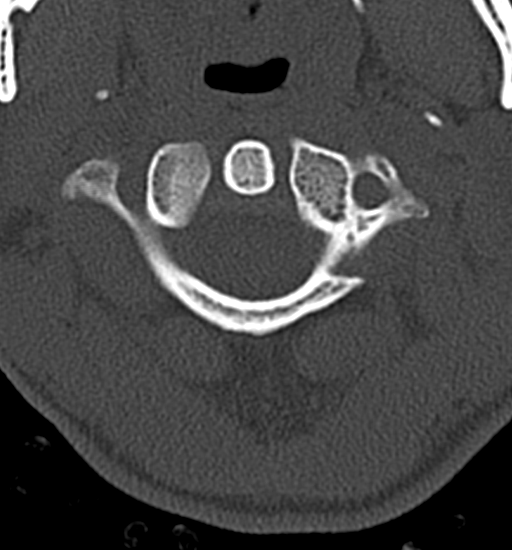
[im 70/77  bone]
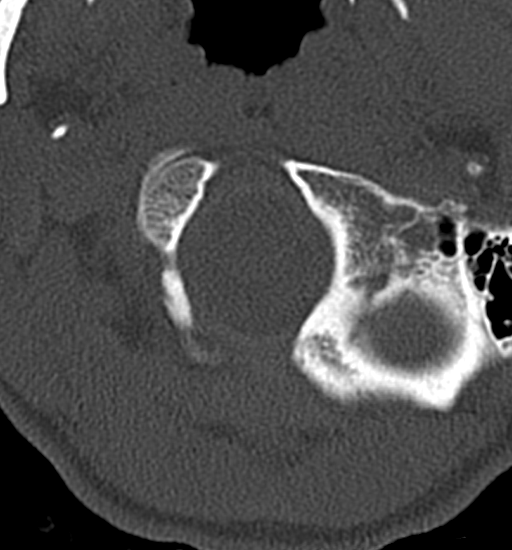

[16 of 47 positions shown; findings below may reference images not displayed]

FINDINGS: CT HEAD FINDINGS

Brain: There is no mass, hemorrhage or extra-axial collection. The
size and configuration of the ventricles and extra-axial CSF spaces
are normal. The brain parenchyma is normal, without evidence of
acute or chronic infarction.

Vascular: No abnormal hyperdensity of the major intracranial
arteries or dural venous sinuses. No intracranial atherosclerosis.

Skull: The visualized skull base, calvarium and extracranial soft
tissues are normal.

Sinuses/Orbits: No fluid levels or advanced mucosal thickening of
the visualized paranasal sinuses. No mastoid or middle ear effusion.
The orbits are normal.

CT CERVICAL SPINE FINDINGS

Alignment: No static subluxation. Facets are aligned. Occipital
condyles are normally positioned.

Skull base and vertebrae: No acute fracture.

Soft tissues and spinal canal: No prevertebral fluid or swelling. No
visible canal hematoma.

Disc levels: No advanced spinal canal or neural foraminal stenosis.

Upper chest: Left apical pneumothorax, incompletely visualized.
Please see report for dedicated CT chest.

Other: Enlarged left thyroid lobe.
IMPRESSION: 1. No acute intracranial abnormality.
2. No acute fracture or static subluxation of the cervical spine.
3. Incompletely visualized left apical pneumothorax. Please see
report for dedicated CT of the chest.

## 2021-02-22 ENCOUNTER — Ambulatory Visit: Payer: Medicaid Other | Admitting: Obstetrics and Gynecology

## 2021-02-23 NOTE — Progress Notes (Deleted)
    Patient, No Pcp Per (Inactive)   No chief complaint on file.   HPI:      Shelly Hamilton is a 30 y.o. G2P1011 whose LMP was No LMP recorded., presents today for STD testing  Neg pap/neg gon/chlam 6/22  There are no problems to display for this patient.   Past Surgical History:  Procedure Laterality Date   CESAREAN SECTION      Family History  Problem Relation Age of Onset   Thyroid cancer Maternal Grandmother     Social History   Socioeconomic History   Marital status: Single    Spouse name: Not on file   Number of children: Not on file   Years of education: Not on file   Highest education level: Not on file  Occupational History   Not on file  Tobacco Use   Smoking status: Former    Types: Cigarettes    Quit date: 05/19/2015    Years since quitting: 5.7   Smokeless tobacco: Never  Vaping Use   Vaping Use: Every day   Substances: Nicotine  Substance and Sexual Activity   Alcohol use: Yes   Drug use: Yes    Types: Marijuana    Comment: Occassionally   Sexual activity: Yes    Birth control/protection: None  Other Topics Concern   Not on file  Social History Narrative   Not on file   Social Determinants of Health   Financial Resource Strain: Not on file  Food Insecurity: Not on file  Transportation Needs: Not on file  Physical Activity: Not on file  Stress: Not on file  Social Connections: Not on file  Intimate Partner Violence: Not on file    No outpatient medications prior to visit.   No facility-administered medications prior to visit.      ROS:  Review of Systems BREAST: No symptoms   OBJECTIVE:   Vitals:  There were no vitals taken for this visit.  Physical Exam  Results: No results found for this or any previous visit (from the past 24 hour(s)).   Assessment/Plan: No diagnosis found.    No orders of the defined types were placed in this encounter.     No follow-ups on file.  Taishaun Levels B. Millard Bautch,  PA-C 02/23/2021 3:20 PM

## 2021-02-24 ENCOUNTER — Ambulatory Visit: Payer: Medicaid Other | Admitting: Obstetrics and Gynecology

## 2021-05-13 ENCOUNTER — Other Ambulatory Visit (HOSPITAL_COMMUNITY)
Admission: RE | Admit: 2021-05-13 | Discharge: 2021-05-13 | Disposition: A | Discharge status: Home / Self Care | Payer: Medicaid Other | Source: Ambulatory Visit | Attending: Advanced Practice Midwife | Admitting: Advanced Practice Midwife

## 2021-05-13 ENCOUNTER — Encounter: Payer: Self-pay | Admitting: Advanced Practice Midwife

## 2021-05-13 ENCOUNTER — Other Ambulatory Visit: Payer: Self-pay

## 2021-05-13 ENCOUNTER — Ambulatory Visit: Payer: Medicaid Other | Admitting: Advanced Practice Midwife

## 2021-05-13 VITALS — BP 110/70 | Ht 62.0 in | Wt 127.0 lb

## 2021-05-13 DIAGNOSIS — N898 Other specified noninflammatory disorders of vagina: Secondary | ICD-10-CM | POA: Insufficient documentation

## 2021-05-13 NOTE — Progress Notes (Signed)
° °  Patient ID: Shelly Hamilton, female   DOB: [birth year], 31 y.o.   MRN: [id number]  Reason for Consult: vaginal odor and Vaginitis   Subjective:  HPI:  Shelly Hamilton is a 31 y.o. female being seen for symptoms of vaginitis. About a week ago she began having thick creamy white discharge and odor. She describes the odor as combination of "ammonia/fishy". She denies itching or irritation. She denies change in body care products. She uses condoms. She does not wear cotton underwear. Reviewed vaginitis preventive measures. She is ok waiting for results before taking medication and prefers oral metronidazole to gel.  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   No current facility-administered medications for this visit.   Review of Systems  Constitutional:  Negative for chills and fever.  HENT:  Negative for congestion, ear discharge, ear pain, hearing loss, sinus pain and sore throat.   Eyes:  Negative for blurred vision and double vision.  Respiratory:  Negative for cough, shortness of breath and wheezing.   Cardiovascular:  Negative for chest pain, palpitations and leg swelling.  Gastrointestinal:  Negative for abdominal pain, blood in stool, constipation, diarrhea, heartburn, melena, nausea and vomiting.  Genitourinary:  Negative for dysuria, flank pain, frequency, hematuria and urgency.       Positive for vaginal discharge and odor  Musculoskeletal:  Negative for back pain, joint pain and  myalgias.  Skin:  Negative for itching and rash.  Neurological:  Negative for dizziness, tingling, tremors, sensory change, speech change, focal weakness, seizures, loss of consciousness, weakness and headaches.  Endo/Heme/Allergies:  Negative for environmental allergies. Does not bruise/bleed easily.  Psychiatric/Behavioral:  Negative for depression, hallucinations, memory loss, substance abuse and suicidal ideas. The patient is not nervous/anxious and does not have insomnia.        Objective:  Objective   Vitals:   05/13/21 0857  BP: 110/70  Weight: 127 lb (57.6 kg)  Height: 5\' 2"  (1.575 m)   Body mass index is 23.23 kg/m. Constitutional: Well nourished, well developed female in no acute distress.  HEENT: normal Skin: Warm and dry.    Extremity:  no edema   Respiratory: Normal respiratory effort Neuro: DTRs 2+, Cranial nerves grossly intact Psych: Alert and Oriented x3. No memory deficits. Normal mood and affect.    Pelvic exam: patient self swabbed aptima   Assessment/Plan:     31 y.o. G2 P38 female with possible vaginitis/BV  Aptima swab Follow up as needed after lab results Oral Metronidazole preferred and will send diflucan in case yeast develops   [facility] 05/13/2021, 9:37 AM

## 2021-05-16 LAB — CERVICOVAGINAL ANCILLARY ONLY
Bacterial Vaginitis (gardnerella): POSITIVE — AB
Candida Glabrata: NEGATIVE
Candida Vaginitis: NEGATIVE
Comment: NEGATIVE
Comment: NEGATIVE
Comment: NEGATIVE

## 2021-05-19 ENCOUNTER — Other Ambulatory Visit: Payer: Self-pay | Admitting: Advanced Practice Midwife

## 2021-05-19 DIAGNOSIS — B9689 Other specified bacterial agents as the cause of diseases classified elsewhere: Secondary | ICD-10-CM

## 2021-05-19 DIAGNOSIS — B3731 Acute candidiasis of vulva and vagina: Secondary | ICD-10-CM

## 2021-05-19 MED ORDER — FLUCONAZOLE 150 MG PO TABS: 150.0000 mg | ORAL_TABLET | Freq: Once | ORAL | 3 refills | Status: AC

## 2021-05-19 MED ORDER — METRONIDAZOLE 500 MG PO TABS: 500.0000 mg | ORAL_TABLET | Freq: Two times a day (BID) | ORAL | 0 refills | Status: AC

## 2021-05-19 NOTE — Progress Notes (Signed)
Rx's sent to treat BV and if yeast develops. Message to patient. ?

## 2021-10-14 ENCOUNTER — Ambulatory Visit: Payer: Medicaid Other | Admitting: Obstetrics and Gynecology

## 2021-11-28 ENCOUNTER — Other Ambulatory Visit: Payer: Self-pay

## 2021-11-28 ENCOUNTER — Emergency Department
Admission: EM | Admit: 2021-11-28 | Discharge: 2021-11-28 | Disposition: A | Discharge status: Home / Self Care | Payer: Medicaid Other | Attending: Emergency Medicine | Admitting: Emergency Medicine

## 2021-11-28 DIAGNOSIS — M436 Torticollis: Secondary | ICD-10-CM | POA: Diagnosis not present

## 2021-11-28 DIAGNOSIS — M542 Cervicalgia: Secondary | ICD-10-CM | POA: Diagnosis present

## 2021-11-28 MED ORDER — CYCLOBENZAPRINE HCL 10 MG PO TABS
10.0000 mg | ORAL_TABLET | Freq: Once | ORAL | Status: AC
  Administered 2021-11-28: 10 mg via ORAL
  Filled 2021-11-28: qty 1

## 2021-11-28 MED ORDER — LIDOCAINE 5 % EX PTCH
1.0000 | MEDICATED_PATCH | CUTANEOUS | Status: DC
  Administered 2021-11-28: 1 via TRANSDERMAL
  Filled 2021-11-28: qty 1

## 2021-11-28 MED ORDER — CYCLOBENZAPRINE HCL 5 MG PO TABS: 5.0000 mg | ORAL_TABLET | Freq: Every day | ORAL | 0 refills | Status: AC

## 2021-11-28 MED ORDER — LIDOCAINE 5 % EX PTCH: 1.0000 | MEDICATED_PATCH | Freq: Two times a day (BID) | CUTANEOUS | 0 refills | Status: AC

## 2021-11-28 MED ORDER — ACETAMINOPHEN 325 MG PO TABS
650.0000 mg | ORAL_TABLET | Freq: Once | ORAL | Status: AC
  Administered 2021-11-28: 650 mg via ORAL
  Filled 2021-11-28: qty 2

## 2021-11-28 MED ORDER — KETOROLAC TROMETHAMINE 15 MG/ML IJ SOLN
15.0000 mg | Freq: Once | INTRAMUSCULAR | Status: AC
  Administered 2021-11-28: 15 mg via INTRAMUSCULAR
  Filled 2021-11-28: qty 1

## 2021-11-28 NOTE — Discharge Instructions (Addendum)
Please return if you develop worsening pain, headaches, numbness, tingling, weakness in your extremities, vision changes, vertigo, or any other concerns.  You may take the medication as prescribed, though remember that this may make you sleepy, you should only take it at nighttime.  You may not drive, operate machinery, perform tasks that require concentration while taking this medication. It was a pleasure caring for you today.

## 2021-11-28 NOTE — ED Provider Notes (Signed)
[facility] Provider Note    Event Date/Time   First MD Initiated Contact with Patient 11/28/21 1618     (approximate)   History   Neck Pain (No injury woke up with neck pain )   HPI  Shelly Hamilton is a 31 y.o. female who presents today for evaluation of right-sided neck pain.  Patient reports that she feels that she has pain with moving her head to the right.  She denies any injury.  She denies headache, visual changes, dizziness, fever.  She has not noticed any numbness, tingling, weakness in her extremities.  She denies any sore throat or constitutional symptoms.  There are no problems to display for this patient.         Physical Exam   Triage Vital Signs: ED Triage Vitals [11/28/21 1606]  Enc Vitals Group     BP 130/86     Pulse Rate 82     Resp 17     Temp 98.3 F (36.8 C)     Temp Source Oral     SpO2 98 %     Weight      Height      Head Circumference      Peak Flow      Pain Score 8     Pain Loc      Pain Edu?      Excl. in GC?     Most recent vital signs: Vitals:   11/28/21 1606  BP: 130/86  Pulse: 82  Resp: 17  Temp: 98.3 F (36.8 C)  SpO2: 98%    Physical Exam Vitals and nursing note reviewed.  Constitutional:      General: Awake and alert. No acute distress.    Appearance: Normal appearance. The patient is normal weight.  HENT:     Head: Normocephalic and atraumatic.     Mouth: Mucous membranes are moist.  Eyes:     General: PERRL. Normal EOMs        Right eye: No discharge.        Left eye: No discharge.     Conjunctiva/sclera: Conjunctivae normal.  Cardiovascular:     Rate and Rhythm: Normal rate and regular rhythm.     Pulses: Normal pulses.     Heart sounds: Normal heart sounds Pulmonary:     Effort: Pulmonary effort is normal. No respiratory distress.     Breath sounds: Normal breath sounds.  Abdominal:     Abdomen is soft. There is no abdominal tenderness. No rebound or guarding. No  distention. Musculoskeletal:        General: No swelling. Normal range of motion.     Cervical back: Normal range of motion and neck supple.  No midline cervical spine tenderness.  Mild tenderness to palpation to right upper trapezius muscle without overlying skin color changes. Negative Spurling test.  Negative Lhermitte sign.  Normal strength and sensation in bilateral upper extremities. Normal grip strength bilaterally.  Normal intrinsic muscle function of the hand bilaterally.  Normal radial pulses bilaterally. Skin:    General: Skin is warm and dry.     Capillary Refill: Capillary refill takes less than 2 seconds.     Findings: No rash.  Neurological:     Mental Status: The patient is awake and alert.      ED Results / Procedures / Treatments   Labs (all labs ordered are listed, but only abnormal results are displayed) Labs Reviewed - No data to display  EKG     RADIOLOGY     PROCEDURES:  Critical Care performed:   Procedures   MEDICATIONS ORDERED IN ED: Medications  lidocaine (LIDODERM) 5 % 1 patch (1 patch Transdermal Patch Applied 11/28/21 1632)  ketorolac (TORADOL) 15 MG/ML injection 15 mg (15 mg Intramuscular Given 11/28/21 1631)  cyclobenzaprine (FLEXERIL) tablet 10 mg (10 mg Oral Given 11/28/21 1631)  acetaminophen (TYLENOL) tablet 650 mg (650 mg Oral Given 11/28/21 1746)     IMPRESSION / MDM / ASSESSMENT AND PLAN / ED COURSE  I reviewed the triage vital signs and the nursing notes.   Differential diagnosis includes, but is not limited to, torticollis, muscle spasm.  No trauma to suggest cervical spine fracture, and no midline cervical spine tenderness.  No radicular symptoms, no paresthesias or weakness in arms to suggest cervical radiculopathy or spinal cord problem.  She has normal grip strength bilaterally, and normal strength in bilateral arms, not consistent with central cord syndrome.  Sore throat or uvular deviation to suggest retropharyngeal  abscess, and no constitutional symptoms to suggest deep space infection.  No headache or fever to suggest meningitis.  She is nontoxic in appearance.  She was treated symptomatically with resolution of symptoms. She declined imaging. She requested a prescription for the flexeril which was prescribed.  She was advised that she cannot drive, operate heavy machinery, or perform tests or require concentration while taking this medication.  She was discharged in the care of her significant other.   Patient's presentation is most consistent with acute illness / injury with system symptoms.    Clinical Course as of 11/28/21 2148  Mon Nov 28, 2021  1805 Upon reevaluation, patient has regained normal range of motion of her neck and she reports that she feels significantly improved and feels ready for discharge, still no headache, vertigo, vision changes, weakness/numbness/tingling [JP]    Clinical Course User Index [JP] Claron Rosencrans, Herb Grays, PA-C     FINAL CLINICAL IMPRESSION(S) / ED DIAGNOSES   Final diagnoses:  Neck pain  Torticollis, acute     Rx / DC Orders   ED Discharge Orders          Ordered    cyclobenzaprine (FLEXERIL) 5 MG tablet  Daily at bedtime        11/28/21 1806    lidocaine (LIDODERM) 5 %  Every 12 hours        11/28/21 1806             Note:  This document was prepared using Dragon voice recognition software and may include unintentional dictation errors.   Keturah Shavers 11/28/21 2148    Concha Se, MD 11/28/21 [phone]

## 2021-11-28 NOTE — ED Triage Notes (Signed)
No injury woke up with neck pain ,

## 2021-11-29 ENCOUNTER — Encounter: Payer: Self-pay | Admitting: Obstetrics and Gynecology

## 2021-11-29 ENCOUNTER — Ambulatory Visit (INDEPENDENT_AMBULATORY_CARE_PROVIDER_SITE_OTHER): Payer: Medicaid Other | Admitting: Obstetrics and Gynecology

## 2021-11-29 VITALS — BP 100/70 | Ht 62.0 in | Wt 124.0 lb

## 2021-11-29 DIAGNOSIS — N930 Postcoital and contact bleeding: Secondary | ICD-10-CM

## 2021-11-29 DIAGNOSIS — R1031 Right lower quadrant pain: Secondary | ICD-10-CM | POA: Diagnosis not present

## 2021-11-29 DIAGNOSIS — Z01419 Encounter for gynecological examination (general) (routine) without abnormal findings: Secondary | ICD-10-CM

## 2021-11-29 NOTE — Progress Notes (Signed)
PCP:  [facility]   Chief Complaint  Patient presents with   Gynecologic Exam    Still having right side pelvic pains week before cycle starts     HPI:      [name] is a 31 y.o. G2P1011 whose LMP was Patient's last menstrual period was 11/21/2021 (exact date)., presents today for her annual examination.  Her menses are regular every 28-30 days, lasting 3 days, mod flow, with up to quarter sized clots at times.  Dysmenorrhea mod, improved with NSAIDs. Also has RLQ achy/crampy/stabbing pains week before menses, improved with BC powder. Had neg GYN u/s and pelvic CT. She does not have intermenstrual bleeding.  Sex activity: single partner, contraception - none. Conception ok. Not taking PNVs. Declines STD testing. Has occas red bleeding during sex that resolves a few hrs later. Sx seem to be more mid-cycle, no pain. No new partners. Neg STD testing 6/22 Last Pap: 09/08/20 Results were: no abnormalities /neg HPV DNA  Hx of STDs: HSV 2 seropositive, chlamydia  There is no FH of breast cancer. There is no FH of ovarian cancer. The patient does not do self-breast exams.  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 No drug use.  Exercise: moderately active  She does get adequate calcium but not Vitamin D in her diet.  Woke up yesterday with back pain, couldn't move. Saw PCP and given flexeril. Helped last night, feeling better. 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    Past Surgical History:  Procedure Laterality Date   CESAREAN SECTION      Family History  Problem Relation Age of Onset   Thyroid cancer Maternal Grandmother     Social History   Socioeconomic History   Marital status: Single    Spouse name: Not on file   Number of children: Not on file   Years of education: Not on file   Highest education  level: Not on file  Occupational History   Not on file  Tobacco Use   Smoking status: Former    Types: Cigarettes    Quit date: 05/19/2015    Years since quitting: 6.5   Smokeless tobacco: Never  Vaping Use   Vaping Use: Every day   Substances: Nicotine  Substance and Sexual Activity   Alcohol use: Yes   Drug use: Yes    Types: Marijuana    Comment: Occassionally   Sexual activity: Yes    Birth control/protection: None  Other Topics Concern   Not on file  Social History Narrative   Not on file   Social Determinants of Health   Financial Resource Strain: Not on file  Food Insecurity: Not on file  Transportation Needs: Not on file  Physical Activity: Not on file  Stress: Not on file  Social Connections: Not on file  Intimate Partner Violence: Not on file     Current Outpatient Medications:    cyclobenzaprine (FLEXERIL) 5 MG tablet, Take 1 tablet (5 mg total) by mouth at bedtime for 7 days., Disp: 7 tablet, Rfl: 0   lidocaine (LIDODERM) 5 %, Place 1 patch onto the skin every 12 (twelve) hours for 5 days. Remove & Discard patch within 12 hours or as directed by MD (Patient not taking: Reported on 11/29/2021), Disp: 10 patch, Rfl: 0  ROS:  Review of Systems  Constitutional:  Negative for fatigue, fever and unexpected weight change.  Respiratory:  Negative for cough, shortness of breath and wheezing.   Cardiovascular:  Negative for chest pain, palpitations and leg swelling.  Gastrointestinal:  Negative for blood in stool, constipation, diarrhea, nausea and vomiting.  Endocrine: Negative for cold intolerance, heat intolerance and polyuria.  Genitourinary:  Positive for pelvic pain and vaginal bleeding. Negative for dyspareunia, dysuria, flank pain, frequency, genital sores, hematuria, menstrual problem, urgency, vaginal discharge and vaginal pain.  Musculoskeletal:  Negative for back pain, joint swelling and myalgias.  Skin:  Negative for rash.  Neurological:  Positive  for headaches. Negative for dizziness, syncope, light-headedness and numbness.  Hematological:  Negative for adenopathy.  Psychiatric/Behavioral:  Negative for agitation, confusion, sleep disturbance and suicidal ideas. The patient is not nervous/anxious.    BREAST: No symptoms   Objective: BP 100/70   Ht 5\' 2"  (1.575 m)   Wt 124 lb (56.2 kg)   LMP 11/21/2021 (Exact Date)   BMI 22.68 kg/m    Physical Exam Constitutional:      Appearance: She is well-developed.  Genitourinary:     Vulva normal.     Right Labia: No rash, tenderness or lesions.    Left Labia: No tenderness, lesions or rash.    No vaginal discharge, erythema or tenderness.      Right Adnexa: not tender and no mass present.    Left Adnexa: not tender and no mass present.    No cervical friability or polyp.     Uterus is not enlarged or tender.  Breasts:    Right: No mass, nipple discharge, skin change or tenderness.     Left: No mass, nipple discharge, skin change or tenderness.  Neck:     Thyroid: No thyromegaly.  Cardiovascular:     Rate and Rhythm: Normal rate and regular rhythm.     Heart sounds: Normal heart sounds. No murmur heard. Pulmonary:     Effort: Pulmonary effort is normal.     Breath sounds: Normal breath sounds.  Abdominal:     Palpations: Abdomen is soft.     Tenderness: There is no abdominal tenderness. There is no guarding or rebound.  Musculoskeletal:        General: Normal range of motion.     Cervical back: Normal range of motion.  Lymphadenopathy:     Cervical: No cervical adenopathy.  Neurological:     General: No focal deficit present.     Mental Status: She is alert and oriented to person, place, and time.     Cranial Nerves: No cranial nerve deficit.  Skin:    General: Skin is warm and dry.  Psychiatric:        Mood and Affect: Mood normal.        Behavior: Behavior normal.        Thought Content: Thought content normal.        Judgment: Judgment normal.  Vitals  reviewed.     Assessment/Plan: Encounter for annual routine gynecological examination  RLQ abdominal pain--wk before menses each month, neg imaging. NSAIDs/heating pad, can try leftover flexeril prn.   PCB (post coital bleeding)--occas sx, neg pap, neg GYN u/s. No new partners. F/u prn.            Start PNVs, f/u prn NOB.   GYN counsel adequate intake of calcium and vitamin D, diet and exercise     F/U  Return in about 1 year (  around 11/30/2022).  Eileene Kisling B. Elohim Brune, PA-C 11/29/2021 4:36 PM

## 2021-11-29 NOTE — Patient Instructions (Signed)
I value your feedback and you entrusting us with your care. If you get a [facility] patient survey, I would appreciate you taking the time to let us know about your experience today. Thank you! ? ? ?

## 2022-10-04 ENCOUNTER — Ambulatory Visit: Payer: Medicaid Other | Admitting: Obstetrics

## 2022-11-28 NOTE — Progress Notes (Signed)
PCP:  [facility]   Chief Complaint  Patient presents with   Gynecologic Exam    Right side pelvic pain x 2 weeks     HPI:      [name] is a 32 y.o. G2P1011 whose LMP was Patient's last menstrual period was 11/23/2022 (exact date)., presents today for her annual examination.  Her menses are regular every 28-30 days, lasting 3-4 days, mod flow, with up to quarter sized clots at times.  Dysmenorrhea mod, improved with NSAIDs. No BTB. Also has RLQ achy/crampy/stabbing pains week before menses. Sx are usually fleeting, occurring 4-5 times in a day. Usually resolve but have persisted this cycle. Helps to rub area; woke her up from sleep last night. Sx are more in RT groin area. No aggrav factors. Has started doing some yoga.  Had neg GYN u/s 2021 and pelvic CT 2020.   Sex activity: single partner, contraception - none. No pain/bleeding/dryness. Conception ok. Hasn't conceived with current partner and has been with him over 6 months. Has 10 yr son, s/p C/S; s/p 1 EAB with pills since that pregnancy. Not pregnant since. Current partner with 76 yo child.  Last Pap: 09/08/20 Results were: no abnormalities /neg HPV DNA  Hx of STDs: HSV 2 seropositive, chlamydia  There is no FH of breast cancer. There is no FH of ovarian cancer. The patient does self-breast exams.  Tobacco use: vapes daily, trying to quit Alcohol use: occas Occas MJ use. Exercise: moderately active  She does get adequate calcium and Vitamin D in her diet.   Pt notices bulge in abn area at times, can push it back in. Makes her "stomach feel weird".    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    Past Surgical History:  Procedure Laterality Date   CESAREAN SECTION      Family History  Problem Relation Age of Onset   Thyroid cancer Maternal Grandmother     Social History   Socioeconomic History   Marital status: Single    Spouse name: Not on file   Number of  children: Not on file   Years of education: Not on file   Highest education level: Not on file  Occupational History   Not on file  Tobacco Use   Smoking status: Former    Current packs/day: 0.00    Types: Cigarettes    Quit date: 05/19/2015    Years since quitting: 7.5   Smokeless tobacco: Never  Vaping Use   Vaping status: Every Day   Substances: Nicotine  Substance and Sexual Activity   Alcohol use: Yes    Comment: occ   Drug use: Yes    Types: Marijuana    Comment: Occassionally   Sexual activity: Yes    Birth control/protection: None  Other Topics Concern   Not on file  Social History Narrative   Not on file   Social Determinants of Health   Financial Resource Strain: Not on file  Food Insecurity: Not on file  Transportation Needs: Not on file  Physical Activity: Not on file  Stress: Not on file  Social Connections: Not on file  Intimate Partner Violence: Not on file    No current outpatient medications on file.     ROS:  Review of Systems  Constitutional:  Negative for fatigue, fever and unexpected weight change.  Respiratory:  Negative for cough, shortness of breath and wheezing.   Cardiovascular:  Negative for chest pain, palpitations and leg swelling.  Gastrointestinal:  Negative for blood in stool, constipation, diarrhea, nausea and vomiting.  Endocrine: Negative for cold intolerance, heat intolerance and polyuria.  Genitourinary:  Positive for pelvic pain. Negative for dyspareunia, dysuria, flank pain, frequency, genital sores, hematuria, menstrual problem, urgency, vaginal bleeding, vaginal discharge and vaginal pain.  Musculoskeletal:  Negative for back pain, joint swelling and myalgias.  Skin:  Negative for rash.  Neurological:  Negative for dizziness, syncope, light-headedness, numbness and headaches.  Hematological:  Negative for adenopathy.  Psychiatric/Behavioral:  Negative for agitation, confusion, sleep disturbance and suicidal ideas. The  patient is not nervous/anxious.    BREAST: No symptoms   Objective: BP 100/72   Ht 5\' 3"  (1.6 m)   Wt 131 lb (59.4 kg)   LMP 11/23/2022 (Exact Date)   BMI 23.21 kg/m    Physical Exam Constitutional:      Appearance: She is well-developed.  Genitourinary:     Vulva normal.     Genitourinary Comments: NEG GYN EXAM FOR RLQ PAIN     Right Labia: No rash, tenderness or lesions.    Left Labia: No tenderness, lesions or rash.    No vaginal discharge, erythema or tenderness.      Right Adnexa: not tender and no mass present.    Left Adnexa: not tender and no mass present.    No cervical friability or polyp.     Uterus is not enlarged or tender.  Breasts:    Right: No mass, nipple discharge, skin change or tenderness.     Left: No mass, nipple discharge, skin change or tenderness.  Neck:     Thyroid: No thyromegaly.  Cardiovascular:     Rate and Rhythm: Normal rate and regular rhythm.     Heart sounds: Normal heart sounds. No murmur heard. Pulmonary:     Effort: Pulmonary effort is normal.     Breath sounds: Normal breath sounds.  Abdominal:     Palpations: Abdomen is soft.     Tenderness: There is no abdominal tenderness. There is no guarding or rebound.     Hernia: A hernia is present. Hernia is present in the ventral area.    Musculoskeletal:        General: Normal range of motion.     Cervical back: Normal range of motion.  Lymphadenopathy:     Cervical: No cervical adenopathy.  Neurological:     General: No focal deficit present.     Mental Status: She is alert and oriented to person, place, and time.     Cranial Nerves: No cranial nerve deficit.  Skin:    General: Skin is warm and dry.  Psychiatric:        Mood and Affect: Mood normal.        Behavior: Behavior normal.        Thought Content: Thought content normal.        Judgment: Judgment normal.  Vitals reviewed.    Results for orders placed or performed in visit on 12/04/22 (from the past 24 hour(s))   POCT urine pregnancy     Status: Normal   Collection Time: 12/04/22  4:59 PM  Result Value Ref Range   Preg Test, Ur Negative Negative     Assessment/Plan: Encounter for annual routine gynecological examination  Ventral hernia without obstruction or gangrene - Plan: US ABDOMEN LIMITED RUQ (LIVER/GB); chck u/s to rule out hernia vs diastasis recti.   RLQ abdominal pain - Plan: POCT  urine pregnancy; neg UPT. Sx for a couple yrs; seem more MSK that GYN in etiology. Try pelvis/low back stretching. F/u prn.   Can try urine ovulation pred kit for pregnancy. If pos, HSG is next step and pt can f/u when desires pregnancy.    GYN counsel adequate intake of calcium and vitamin D, diet and exercise     F/U  Return in about 1 year (around 12/04/2023).  Spike Desilets B. Jamyah Folk, PA-C 12/04/2022 4:59 PM

## 2022-12-04 ENCOUNTER — Ambulatory Visit (INDEPENDENT_AMBULATORY_CARE_PROVIDER_SITE_OTHER): Payer: Medicaid Other | Admitting: Obstetrics and Gynecology

## 2022-12-04 ENCOUNTER — Encounter: Payer: Self-pay | Admitting: Obstetrics and Gynecology

## 2022-12-04 VITALS — BP 100/72 | Ht 63.0 in | Wt 131.0 lb

## 2022-12-04 DIAGNOSIS — Z01419 Encounter for gynecological examination (general) (routine) without abnormal findings: Secondary | ICD-10-CM

## 2022-12-04 DIAGNOSIS — K439 Ventral hernia without obstruction or gangrene: Secondary | ICD-10-CM

## 2022-12-04 DIAGNOSIS — R1031 Right lower quadrant pain: Secondary | ICD-10-CM

## 2022-12-04 DIAGNOSIS — Z3202 Encounter for pregnancy test, result negative: Secondary | ICD-10-CM | POA: Diagnosis not present

## 2022-12-04 LAB — POCT URINE PREGNANCY: Preg Test, Ur: NEGATIVE

## 2022-12-04 NOTE — Patient Instructions (Signed)
I value your feedback and you entrusting us with your care. If you get a [facility] patient survey, I would appreciate you taking the time to let us know about your experience today. Thank you! ? ? ?

## 2022-12-19 ENCOUNTER — Other Ambulatory Visit: Payer: Medicaid Other

## 2022-12-20 ENCOUNTER — Encounter: Payer: Self-pay | Admitting: Obstetrics and Gynecology

## 2023-03-29 ENCOUNTER — Other Ambulatory Visit (HOSPITAL_COMMUNITY)
Admission: RE | Admit: 2023-03-29 | Discharge: 2023-03-29 | Disposition: A | Discharge status: Home / Self Care | Payer: Medicaid Other | Source: Ambulatory Visit | Attending: Obstetrics & Gynecology | Admitting: Obstetrics & Gynecology

## 2023-03-29 ENCOUNTER — Ambulatory Visit: Payer: Medicaid Other | Admitting: Obstetrics & Gynecology

## 2023-03-29 VITALS — BP 131/84 | HR 72 | Ht 63.0 in | Wt 134.2 lb

## 2023-03-29 DIAGNOSIS — Z113 Encounter for screening for infections with a predominantly sexual mode of transmission: Secondary | ICD-10-CM

## 2023-03-29 DIAGNOSIS — Z202 Contact with and (suspected) exposure to infections with a predominantly sexual mode of transmission: Secondary | ICD-10-CM | POA: Diagnosis not present

## 2023-03-29 NOTE — Progress Notes (Signed)
    GYNECOLOGY PROGRESS NOTE  Subjective:    Patient ID: Shelly Hamilton, female    DOB: Oct 03, 1990, 33 y.o.   MRN: [id number]  HPI  Patient is a 33 y.o. single monogamous [id number] (39 year old son) here today because she heard a rumor that one of her previous sexual partners has HIV. She would like testing for STIs, including HSV 2. She is not having any symptoms or problems today.  The following portions of the patient's history were reviewed and updated as appropriate: allergies, current medications, past family history, past medical history, past social history, past surgical history, and problem list.  Review of Systems Pap normal 2022, + HR HPV in 2021  Objective:   Blood pressure 131/84, pulse 72, height 5' 3 (1.6 m), weight 134 lb 3.2 oz (60.9 kg), last menstrual period 03/23/2023. Body mass index is 23.77 kg/m. Well nourished, well hydrated Black female, no apparent distress She is ambulating and conversing normally. EG, vagina, cervix- all normal No abnormal-appearing discharge    Assessment:   1. Screening for STD (sexually transmitted disease)      Plan:   Testing done per patient request

## 2023-03-31 LAB — RPR: RPR Ser Ql: NONREACTIVE

## 2023-03-31 LAB — HEPATITIS C ANTIBODY: Hep C Virus Ab: NONREACTIVE

## 2023-03-31 LAB — HEPATITIS B SURFACE ANTIGEN: Hepatitis B Surface Ag: NEGATIVE

## 2023-03-31 LAB — HSV 1 AND 2 AB, IGG
HSV 1 Glycoprotein G Ab, IgG: NONREACTIVE
HSV 2 IgG, Type Spec: REACTIVE — AB

## 2023-03-31 LAB — HIV ANTIBODY (ROUTINE TESTING W REFLEX): HIV Screen 4th Generation wRfx: NONREACTIVE

## 2023-04-02 LAB — CERVICOVAGINAL ANCILLARY ONLY
Bacterial Vaginitis (gardnerella): POSITIVE — AB
Candida Glabrata: NEGATIVE
Candida Vaginitis: NEGATIVE
Chlamydia: NEGATIVE
Comment: NEGATIVE
Comment: NEGATIVE
Comment: NEGATIVE
Comment: NEGATIVE
Comment: NEGATIVE
Comment: NORMAL
Neisseria Gonorrhea: NEGATIVE
Trichomonas: NEGATIVE

## 2023-04-05 ENCOUNTER — Emergency Department
Admission: EM | Admit: 2023-04-05 | Discharge: 2023-04-05 | Disposition: A | Discharge status: Home / Self Care | Payer: Medicaid Other | Attending: Emergency Medicine | Admitting: Emergency Medicine

## 2023-04-05 ENCOUNTER — Other Ambulatory Visit: Payer: Self-pay | Admitting: Obstetrics & Gynecology

## 2023-04-05 ENCOUNTER — Encounter: Payer: Self-pay | Admitting: Obstetrics & Gynecology

## 2023-04-05 ENCOUNTER — Other Ambulatory Visit: Payer: Self-pay

## 2023-04-05 DIAGNOSIS — Y9362 Activity, american flag or touch football: Secondary | ICD-10-CM | POA: Diagnosis not present

## 2023-04-05 DIAGNOSIS — X58XXXA Exposure to other specified factors, initial encounter: Secondary | ICD-10-CM | POA: Insufficient documentation

## 2023-04-05 DIAGNOSIS — S4991XA Unspecified injury of right shoulder and upper arm, initial encounter: Secondary | ICD-10-CM | POA: Diagnosis present

## 2023-04-05 DIAGNOSIS — B9689 Other specified bacterial agents as the cause of diseases classified elsewhere: Secondary | ICD-10-CM

## 2023-04-05 DIAGNOSIS — S46911A Strain of unspecified muscle, fascia and tendon at shoulder and upper arm level, right arm, initial encounter: Secondary | ICD-10-CM | POA: Diagnosis not present

## 2023-04-05 MED ORDER — METRONIDAZOLE 500 MG PO TABS: 500.0000 mg | ORAL_TABLET | Freq: Two times a day (BID) | ORAL | 0 refills | Status: DC

## 2023-04-05 MED ORDER — MELOXICAM 15 MG PO TABS: 15.0000 mg | ORAL_TABLET | Freq: Every day | ORAL | 0 refills | Status: DC

## 2023-04-05 MED ORDER — BACLOFEN 10 MG PO TABS: 10.0000 mg | ORAL_TABLET | Freq: Three times a day (TID) | ORAL | 0 refills | Status: AC

## 2023-04-05 NOTE — Discharge Instructions (Signed)
Apply ice to right shoulder, do not use heat as this causes more swelling and inflammation in the muscles

## 2023-04-05 NOTE — ED Provider Notes (Signed)
[facility] Provider Note    None    (approximate)   History   Neck Injury   HPI  Shelly Hamilton is a 33 y.o. female with no significant past medical history presents emergency department complaint of neck pain for 1 month.  Mostly pain with movement.  Feels like it is in her shoulder.  Played flag football pain started later that night.  No numbness or tingling.  Pain does radiate into the upper arm.      Physical Exam   Triage Vital Signs: ED Triage Vitals  Encounter Vitals Group     BP 04/05/23 0824 137/89     Systolic BP Percentile --      Diastolic BP Percentile --      Pulse Rate 04/05/23 0824 63     Resp 04/05/23 0824 16     Temp 04/05/23 0824 98.2 F (36.8 C)     Temp Source 04/05/23 0824 Oral     SpO2 04/05/23 0824 100 %     Weight --      Height --      Head Circumference --      Peak Flow --      Pain Score 04/05/23 0825 9     Pain Loc --      Pain Education --      Exclude from Growth Chart --     Most recent vital signs: Vitals:   04/05/23 0824  BP: 137/89  Pulse: 63  Resp: 16  Temp: 98.2 F (36.8 C)  SpO2: 100%     General: Awake, no distress.   CV:  Good peripheral perfusion. regular rate and  rhythm Resp:  Normal effort. Lungs  Abd:  No distention.   Other:  C-spine nontender, full range of motion of the neck, right shoulder has no bony tenderness, tender along the musculature, neurovascular intact   ED Results / Procedures / Treatments   Labs (all labs ordered are listed, but only abnormal results are displayed) Labs Reviewed - No data to display   EKG     RADIOLOGY     PROCEDURES:   Procedures   MEDICATIONS ORDERED IN ED: Medications - No data to display   IMPRESSION / MDM / ASSESSMENT AND PLAN / ED COURSE  I reviewed the triage vital signs and the nursing notes.                              Differential diagnosis includes, but is not limited to, muscle spasm, cervical  radiculopathy, contusion, strain  Patient's presentation is most consistent with acute illness / injury with system symptoms.   Feel this is most likely muscle strain.  Patient was placed on meloxicam and baclofen.  She is to follow-up with regular doctor.  Given a work note.  Discharged stable condition.  Is to follow-up with orthopedics if not improving in 1 week.  Explained to her the x-rays would not show anything other than bone and this is not a bone problem.      FINAL CLINICAL IMPRESSION(S) / ED DIAGNOSES   Final diagnoses:  Strain of right shoulder, initial encounter     Rx / DC Orders   ED Discharge Orders          Ordered    meloxicam (MOBIC) 15 MG tablet  Daily        04/05/23 0827    baclofen (LIORESAL) 10  MG tablet  3 times daily        04/05/23 0827             Note:  This document was prepared using Dragon voice recognition software and may include unintentional dictation errors.    Faythe Ghee, PA-C 04/05/23 1601    Dionne Bucy, MD 04/05/23 1224

## 2023-04-05 NOTE — ED Triage Notes (Signed)
Pt states that she played a flag football game approx a  month ago. Pt states that she has been hurting since, states that pain started later that night. Pt states that the pain is in the right side of her neck and is radiating down to her elbow in her right arm. Pt states that the pain is worse when she turns her head to the right

## 2023-04-05 NOTE — Progress Notes (Signed)
Flagyl prescribed to treat bv

## 2023-04-26 ENCOUNTER — Other Ambulatory Visit: Payer: Self-pay | Admitting: Obstetrics and Gynecology

## 2023-04-26 MED ORDER — MICROGESTIN 24 FE 1-20 MG-MCG PO TABS: 1.0000 | ORAL_TABLET | Freq: Every day | ORAL | 2 refills | Status: DC

## 2023-04-26 NOTE — Progress Notes (Signed)
 Rx OCPs for BC. Annual due 9/25. No hx of HTN, DVTs, migraines with aura. Not on OCPs in past.

## 2023-11-01 ENCOUNTER — Ambulatory Visit (INDEPENDENT_AMBULATORY_CARE_PROVIDER_SITE_OTHER)

## 2023-11-01 ENCOUNTER — Other Ambulatory Visit (HOSPITAL_COMMUNITY)
Admission: RE | Admit: 2023-11-01 | Discharge: 2023-11-01 | Disposition: A | Discharge status: Home / Self Care | Source: Ambulatory Visit | Attending: Certified Nurse Midwife | Admitting: Certified Nurse Midwife

## 2023-11-01 VITALS — BP 128/84 | HR 91 | Ht 63.0 in | Wt 133.5 lb

## 2023-11-01 DIAGNOSIS — O3680X0 Pregnancy with inconclusive fetal viability, not applicable or unspecified: Secondary | ICD-10-CM

## 2023-11-01 DIAGNOSIS — Z3201 Encounter for pregnancy test, result positive: Secondary | ICD-10-CM | POA: Diagnosis not present

## 2023-11-01 DIAGNOSIS — N898 Other specified noninflammatory disorders of vagina: Secondary | ICD-10-CM | POA: Insufficient documentation

## 2023-11-01 DIAGNOSIS — R8290 Unspecified abnormal findings in urine: Secondary | ICD-10-CM

## 2023-11-01 DIAGNOSIS — N912 Amenorrhea, unspecified: Secondary | ICD-10-CM

## 2023-11-01 LAB — POCT URINALYSIS DIPSTICK OB
Bilirubin, UA: NEGATIVE
Blood, UA: NEGATIVE
Glucose, UA: NEGATIVE
Ketones, UA: NEGATIVE
Leukocytes, UA: NEGATIVE
Nitrite, UA: POSITIVE
POC,PROTEIN,UA: NEGATIVE
Spec Grav, UA: 1.01 (ref 1.010–1.025)
Urobilinogen, UA: 0.2 U/dL
pH, UA: 6.5 (ref 5.0–8.0)

## 2023-11-01 LAB — POCT URINE PREGNANCY: Preg Test, Ur: POSITIVE — AB

## 2023-11-01 MED ORDER — NITROFURANTOIN MONOHYD MACRO 100 MG PO CAPS: 100.0000 mg | ORAL_CAPSULE | Freq: Two times a day (BID) | ORAL | 0 refills | Status: DC

## 2023-11-01 NOTE — Progress Notes (Signed)
    NURSE VISIT NOTE  Subjective:    Patient ID: Shelly Hamilton, female    DOB: Mar 13, 1991, 33 y.o.   MRN: [id number]  HPI  Patient is a 33 y.o. G74P1011 female who presents for evaluation of amenorrhea. She believes she could be pregnant. Pregnancy is desired. Sexual Activity: single partner, contraception: none. Current symptoms also include: breast tenderness, fatigue, nausea, and positive home pregnancy test. Last period was normal but light. She has complaints about pain in her right side x 1 year and pain has increased in frequency. She has been having some odor in her urine, as well as vaginal odor with some vaginal irritation x 3 weeks.    Objective:    BP 128/84   Pulse 91   Ht 5' 3 (1.6 m)   Wt 133 lb 8 oz (60.6 kg)   LMP 09/28/2023   BMI 23.65 kg/m   Lab Review  Results for orders placed or performed in visit on 11/01/23  POCT urine pregnancy  Result Value Ref Range   Preg Test, Ur Positive (A) Negative  POC Urinalysis Dipstick OB  Result Value Ref Range   Color, UA     Clarity, UA     Glucose, UA Negative Negative   Bilirubin, UA Negative    Ketones, UA Negative    Spec Grav, UA 1.010 1.010 - 1.025   Blood, UA Negative    pH, UA 6.5 5.0 - 8.0   POC,PROTEIN,UA Negative Negative, Trace, Small (1+), Moderate (2+), Large (3+), 4+   Urobilinogen, UA 0.2 0.2 or 1.0 E.U./dL   Nitrite, UA Positive    Leukocytes, UA Negative Negative   Appearance     Odor      Assessment:   1. Amenorrhea   2. Vaginal irritation     Plan:   Macrobid  100 mg twice daily for 7 days Pregnancy Test: Positive  Estimated Date of Delivery: 07/04/24 BP Cuff Measurement taken. Cuff Size Adult Small Encouraged well-balanced diet, plenty of rest when needed, pre-natal vitamins daily and walking for exercise.  Discussed self-help for nausea, avoiding OTC medications until consulting provider or pharmacist, other than Tylenol  as needed, minimal caffeine (1-2 cups daily) and avoiding alcohol.    She will schedule her nurse visit @ 7-[redacted] wks pregnant, u/s for dating @10  wk, and NOB visit at [redacted] wk pregnant.    Feel free to call with any questions.    Camelia Fetters, CMA [facility]

## 2023-11-01 NOTE — Patient Instructions (Signed)
 Good Morning,   'Morning sickness' can occur at any time during the day and generally goes away by 16 weeks of pregnancy.    Have saltine crackers or pretzels by your bed and eat a few bites before you raise your head out of bed in the morning   Eat small frequent meals throughout the day instead of large meals   Drink plenty of fluids throughout the day to stay hydrated, just don't drink a lot of fluids with your meals.  This can make your stomach fill up faster making you feel sick   Do not brush your teeth right after you eat   Products with real ginger are good for nausea, like ginger ale and ginger hard candy Make sure it says made with real ginger!   Sucking on sour candy like lemon heads is also good for nausea   If your prenatal vitamins make you nauseated, take them at night so you will sleep through the nausea   Sea Bands   You can try over the counter Vitamin B6 (pyridoxine) 25mg  four times a day OR 50mg  twice a day. Add Unisom (doxylamine) 12.5mg  (1/2 tab) in the morning, 12.5mg  6-8 hours later, then 25mg  every night if symptoms do not improve with Vitamin B6 alone   If you feel like you need prescription medicine for the nausea & vomiting please let us  know   If you are unable to keep any fluids or food down please let us  know.You can call us  at [phone] or Mychart anytime!    Morning Sickness Morning sickness is when you throw up or feel like you may throw up during pregnancy. This condition often occurs in the morning, but it can also occur at any time of day. Morning sickness is most common during the first three months of pregnancy, but it can go on throughout the pregnancy. Morning sickness is usually harmless. But if you throw up all the time, you should see your health care provider. You may also hear this condition called nausea and vomiting of pregnancy. What are the causes? The cause of morning sickness is not known. It may be linked to changes in hormones  during pregnancy. What increases the risk? You're more likely to have morning sickness if: You had morning sickness in another pregnancy. You're pregnant with more than one baby, such as twins. You had morning sickness in other pregnancies. You have had motion sickness before you were pregnant. You have had bad headaches or migraines before you were pregnant. What are the signs or symptoms? Symptoms of morning sickness include: Feeling like you may throw up. Throwing up. How is this diagnosed? Morning sickness is diagnosed based on your symptoms. How is this treated? Treatment is usually not needed for morning sickness. You may only need to change what you eat. In some cases, your provider may give you: Vitamin B6 supplements. Medicines to prevent throwing up. Ginger. Follow these instructions at home: Medicines Take your medicines only as told by your provider. Do not use any prescription, over-the-counter, or herbal medicines for morning sickness without first talking with your provider. Take prenatal vitamins. These can stop or lessen the symptoms of morning sickness. If you feel like you may throw up after taking prenatal vitamins, take them at night or with a snack. Eating and drinking     Eat dry toast or crackers before getting out of bed. Eat 5 or 6 small meals a day. Try ginger ale made with real ginger, ginger  tea, or ginger candies. Drink fluids throughout the day. Eat protein foods when you need a snack. Nuts, yogurt, and cheese are good choices. Eat dry and bland foods like rice or baked potatoes. Foods that are high in carbohydrates are often helpful. Have someone cook for you if the smell of food makes you want to throw up. Foods to avoid Greasy foods. Fatty foods. Spicy foods. General instructions Try to avoid smells that make you feel sick. Use an air purifier to keep the air in your house free of smells. Try using an acupressure wristband. This is a  wristband that's used to treat motion sickness. Try acupuncture. In this treatment, a provider puts thin needles into certain areas of your body to make you feel better. Brush your teeth after throwing up or rinse with a mix of baking soda and water. The acid in throw-up can hurt your teeth. Contact a health care provider if: Your symptoms do not get better. You feel dizzy or light-headed. You're losing weight. Get help right away if: The feeling that you may throw up will not go away, or you can't stop throwing up. You faint. You have very bad pain in your belly. This information is not intended to replace advice given to you by your health care provider. Make sure you discuss any questions you have with your health care provider. Document Revised: 12/07/2022 Document Reviewed: 06/15/2022 Elsevier Patient Education  2024 [employer].  First Trimester of Pregnancy  The first trimester of pregnancy starts on the first day of your last monthly period until the end of week 13. This is months 1 through 3 of pregnancy. A week after a sperm fertilizes an egg, the egg will implant into the wall of the uterus and begin to develop into a baby. Body changes during your first trimester Your body goes through many changes during pregnancy. The changes usually return to normal after your baby is born. Physical changes Your breasts may grow larger and may hurt. The area around your nipples may get darker. Your periods will stop. Your hair and nails may grow faster. You may pee more often. Health changes You may tire easily. Your gums may bleed and may be sensitive when you brush and floss. You may not feel hungry. You may have heartburn. You may throw up or feel like you may throw up. You may want to eat some foods, but not others. You may have headaches. You may have trouble pooping (constipation). Other changes Your emotions may change from day to day. You may have more dreams. Follow  these instructions at home: Medicines Talk to your health care provider if you're taking medicines. Ask if the medicines are safe to take during pregnancy. Your provider may change the medicines that you take. Do not take any medicines unless told to by your provider. Take a prenatal vitamin that has at least 600 micrograms (mcg) of folic acid. Do not use herbal medicines, illegal substances, or medicines that are not approved by your provider. Eating and drinking While you're pregnant your body needs extra food for your growing baby. Talk with your provider about what to eat while pregnant. Activity Most women are able to exercise during pregnancy. Exercises may need to change as your pregnancy goes on. Talk to your provider about your activities and exercise routines. Relieving pain and discomfort Wear a good, supportive bra if your breasts hurt. Rest with your legs raised if you have leg cramps or low back pain. Safety  Wear your seatbelt at all times when you're in a car. Talk to your provider if someone hits you, hurts you, or yells at you. Talk with your provider if you're feeling sad or have thoughts of hurting yourself. Lifestyle Certain things can be harmful while you're pregnant. Follow these rules: Do not use hot tubs, steam rooms, or saunas. Do not douche. Do not use tampons or scented pads. Do not drink alcohol,smoke, vape, or use products with nicotine or tobacco in them. If you need help quitting, talk with your provider. Avoid cat litter boxes and soil used by cats. These things carry germs that can cause harm to your pregnancy and your baby. General instructions Keep all follow-up visits. It helps you and your unborn baby stay as healthy as possible. Write down your questions. Take them to your visits. Your provider will: Talk with you about your overall health. Give you advice or refer you to specialists who can help with different needs, including: Prenatal education  classes. Mental health and counseling. Foods and healthy eating. Ask for help if you need help with food. Call your dentist and ask to be seen. Brush your teeth with a soft toothbrush. Floss gently. Where to find more information American Pregnancy Association: americanpregnancy.org [employer]: acog.org Office on [employer] Health: [employer] Contact a health care provider if: You feel dizzy, faint, or have a fever. You vomit or have watery poop (diarrhea) for 2 days or more. You have abnormal discharge or bleeding from your vagina. You have pain when you pee or your pee smells bad. You have cramps, pain, or pressure in your belly area. Get help right away if: You have trouble breathing or chest pain. You have any kind of injury, such as from a fall or a car crash. These symptoms may be an emergency. Get help right away. Call 911. Do not wait to see if the symptoms will go away. Do not drive yourself to the hospital. This information is not intended to replace advice given to you by your health care provider. Make sure you discuss any questions you have with your health care provider. Document Revised: 12/07/2022 Document Reviewed: 07/07/2022 Elsevier Patient Education  2024 [employer] Inc.Commonly Asked Questions During Pregnancy  Cats: A parasite can be excreted in cat feces.  To avoid exposure you need to have another person empty the little box.  If you must empty the litter box you will need to wear gloves.  Wash your hands after handling your cat.  This parasite can also be found in raw or undercooked meat so this should also be avoided.  Colds, Sore Throats, Flu: Please check your medication sheet to see what you can take for symptoms.  If your symptoms are unrelieved by these medications please call the office.  Dental Work: Most any dental work Agricultural consultant recommends is permitted.  X-rays should only be taken during the first trimester  if absolutely necessary.  Your abdomen should be shielded with a lead apron during all x-rays.  Please notify your provider prior to receiving any x-rays.  Novocaine is fine; gas is not recommended.  If your dentist requires a note from us  prior to dental work please call the office and we will provide one for you.  Exercise: Exercise is an important part of staying healthy during your pregnancy.  You may continue most exercises you were accustomed to prior to pregnancy.  Later in your pregnancy you will most likely notice you have  difficulty with activities requiring balance like riding a bicycle.  It is important that you listen to your body and avoid activities that put you at a higher risk of falling.  Adequate rest and staying well hydrated are a must!  If you have questions about the safety of specific activities ask your provider.    Exposure to Children with illness: Try to avoid obvious exposure; report any symptoms to us  when noted,  If you have chicken pos, red measles or mumps, you should be immune to these diseases.   Please do not take any vaccines while pregnant unless you have checked with your OB provider.  Fetal Movement: After 28 weeks we recommend you do kick counts twice daily.  Lie or sit down in a calm quiet environment and count your baby movements kicks.  You should feel your baby at least 10 times per hour.  If you have not felt 10 kicks within the first hour get up, walk around and have something sweet to eat or drink then repeat for an additional hour.  If count remains less than 10 per hour notify your provider.  Fumigating: Follow your pest control agent's advice as to how long to stay out of your home.  Ventilate the area well before re-entering.  Hemorrhoids:   Most over-the-counter preparations can be used during pregnancy.  Check your medication to see what is safe to use.  It is important to use a stool softener or fiber in your diet and to drink lots of liquids.  If  hemorrhoids seem to be getting worse please call the office.   Hot Tubs:  Hot tubs Jacuzzis and saunas are not recommended while pregnant.  These increase your internal body temperature and should be avoided.  Intercourse:  Sexual intercourse is safe during pregnancy as long as you are comfortable, unless otherwise advised by your provider.  Spotting may occur after intercourse; report any bright red bleeding that is heavier than spotting.  Labor:  If you know that you are in labor, please go to the hospital.  If you are unsure, please call the office and let us  help you decide what to do.  Lifting, straining, etc:  If your job requires heavy lifting or straining please check with your provider for any limitations.  Generally, you should not lift items heavier than that you can lift simply with your hands and arms (no back muscles)  Painting:  Paint fumes do not harm your pregnancy, but may make you ill and should be avoided if possible.  Latex or water based paints have less odor than oils.  Use adequate ventilation while painting.  Permanents & Hair Color:  Chemicals in hair dyes are not recommended as they cause increase hair dryness which can increase hair loss during pregnancy.   Highlighting and permanents are allowed.  Dye may be absorbed differently and permanents may not hold as well during pregnancy.  Sunbathing:  Use a sunscreen, as skin burns easily during pregnancy.  Drink plenty of fluids; avoid over heating.  Tanning Beds:  Because their possible side effects are still unknown, tanning beds are not recommended.  Ultrasound Scans:  Routine ultrasounds are performed at approximately 20 weeks.  You will be able to see your baby's general anatomy an if you would like to know the gender this can usually be determined as well.  If it is questionable when you conceived you may also receive an ultrasound early in your pregnancy for dating purposes.  Otherwise  ultrasound exams are not  routinely performed unless there is a medical necessity.  Although you can request a scan we ask that you pay for it when conducted because insurance does not cover  patient request scans.  Work: If your pregnancy proceeds without complications you may work until your due date, unless your physician or employer advises otherwise.  Round Ligament Pain/Pelvic Discomfort:  Sharp, shooting pains not associated with bleeding are fairly common, usually occurring in the second trimester of pregnancy.  They tend to be worse when standing up or when you remain standing for long periods of time.  These are the result of pressure of certain pelvic ligaments called round ligaments.  Rest, Tylenol  and heat seem to be the most effective relief.  As the womb and fetus grow, they rise out of the pelvis and the discomfort improves.  Please notify the office if your pain seems different than that described.  It may represent a more serious condition.

## 2023-11-03 ENCOUNTER — Encounter: Payer: Self-pay | Admitting: Certified Nurse Midwife

## 2023-11-03 DIAGNOSIS — B9689 Other specified bacterial agents as the cause of diseases classified elsewhere: Secondary | ICD-10-CM

## 2023-11-03 LAB — URINE CULTURE

## 2023-11-05 LAB — CERVICOVAGINAL ANCILLARY ONLY
Bacterial Vaginitis (gardnerella): POSITIVE — AB
Candida Glabrata: NEGATIVE
Candida Vaginitis: NEGATIVE
Chlamydia: NEGATIVE
Comment: NEGATIVE
Comment: NEGATIVE
Comment: NEGATIVE
Comment: NEGATIVE
Comment: NEGATIVE
Comment: NORMAL
Neisseria Gonorrhea: NEGATIVE
Trichomonas: NEGATIVE

## 2023-11-05 MED ORDER — METRONIDAZOLE 500 MG PO TABS: 500.0000 mg | ORAL_TABLET | Freq: Two times a day (BID) | ORAL | 0 refills | Status: DC

## 2023-11-07 ENCOUNTER — Other Ambulatory Visit: Payer: Self-pay | Admitting: Certified Nurse Midwife

## 2023-11-20 ENCOUNTER — Emergency Department

## 2023-11-20 ENCOUNTER — Other Ambulatory Visit: Payer: Self-pay

## 2023-11-20 ENCOUNTER — Emergency Department: Admission: EM | Admit: 2023-11-20 | Discharge: 2023-11-20 | Disposition: A | Discharge status: Home / Self Care

## 2023-11-20 DIAGNOSIS — O99011 Anemia complicating pregnancy, first trimester: Secondary | ICD-10-CM | POA: Insufficient documentation

## 2023-11-20 DIAGNOSIS — R1031 Right lower quadrant pain: Secondary | ICD-10-CM | POA: Insufficient documentation

## 2023-11-20 DIAGNOSIS — D649 Anemia, unspecified: Secondary | ICD-10-CM

## 2023-11-20 DIAGNOSIS — Z3A01 Less than 8 weeks gestation of pregnancy: Secondary | ICD-10-CM | POA: Diagnosis not present

## 2023-11-20 DIAGNOSIS — Z3490 Encounter for supervision of normal pregnancy, unspecified, unspecified trimester: Secondary | ICD-10-CM

## 2023-11-20 DIAGNOSIS — O26891 Other specified pregnancy related conditions, first trimester: Secondary | ICD-10-CM | POA: Diagnosis present

## 2023-11-20 LAB — COMPREHENSIVE METABOLIC PANEL WITH GFR
ALT: 8 U/L (ref 0–44)
AST: 18 U/L (ref 15–41)
Albumin: 3.9 g/dL (ref 3.5–5.0)
Alkaline Phosphatase: 28 U/L — ABNORMAL LOW (ref 38–126)
Anion gap: 8 (ref 5–15)
BUN: 9 mg/dL (ref 6–20)
CO2: 25 mmol/L (ref 22–32)
Calcium: 9.1 mg/dL (ref 8.9–10.3)
Chloride: 104 mmol/L (ref 98–111)
Creatinine, Ser: 0.54 mg/dL (ref 0.44–1.00)
GFR, Estimated: >60 mL/min (ref >60)
Glucose, Bld: 104 mg/dL — ABNORMAL HIGH (ref 70–99)
Potassium: 3.4 mmol/L — ABNORMAL LOW (ref 3.5–5.1)
Sodium: 137 mmol/L (ref 135–145)
Total Bilirubin: 0.2 mg/dL (ref 0.0–1.2)
Total Protein: 6.5 g/dL (ref 6.5–8.1)

## 2023-11-20 LAB — CBC
HCT: 26.2 % — ABNORMAL LOW (ref 36.0–46.0)
Hemoglobin: 8.7 g/dL — ABNORMAL LOW (ref 12.0–15.0)
MCH: 25.4 pg — ABNORMAL LOW (ref 26.0–34.0)
MCHC: 33.2 g/dL (ref 30.0–36.0)
MCV: 76.6 fL — ABNORMAL LOW (ref 80.0–100.0)
Platelets: 381 K/uL (ref 150–400)
RBC: 3.42 MIL/uL — ABNORMAL LOW (ref 3.87–5.11)
RDW: 17.3 % — ABNORMAL HIGH (ref 11.5–15.5)
WBC: 8.7 K/uL (ref 4.0–10.5)
nRBC: 0 % (ref 0.0–0.2)

## 2023-11-20 LAB — WET PREP, GENITAL
Clue Cells Wet Prep HPF POC: NONE SEEN
Sperm: NONE SEEN
Trich, Wet Prep: NONE SEEN
WBC, Wet Prep HPF POC: <10 (ref <10)
Yeast Wet Prep HPF POC: NONE SEEN

## 2023-11-20 LAB — POC URINE PREG, ED: Preg Test, Ur: POSITIVE — AB

## 2023-11-20 LAB — URINALYSIS, ROUTINE W REFLEX MICROSCOPIC
Bilirubin Urine: NEGATIVE
Glucose, UA: NEGATIVE mg/dL
Hgb urine dipstick: NEGATIVE
Ketones, ur: NEGATIVE mg/dL
Leukocytes,Ua: NEGATIVE
Nitrite: NEGATIVE
Protein, ur: NEGATIVE mg/dL
Specific Gravity, Urine: 1.017 (ref 1.005–1.030)
pH: 5 (ref 5.0–8.0)

## 2023-11-20 LAB — LIPASE, BLOOD: Lipase: 34 U/L (ref 11–51)

## 2023-11-20 LAB — HCG, QUANTITATIVE, PREGNANCY: hCG, Beta Chain, Quant, S: 70400 m[IU]/mL — ABNORMAL HIGH (ref <5)

## 2023-11-20 LAB — CHLAMYDIA/NGC RT PCR (ARMC ONLY)
Chlamydia Tr: NOT DETECTED
N gonorrhoeae: NOT DETECTED

## 2023-11-20 MED ORDER — ACETAMINOPHEN 500 MG PO TABS
1000.0000 mg | ORAL_TABLET | Freq: Once | ORAL | Status: AC
  Administered 2023-11-20: 1000 mg via ORAL
  Filled 2023-11-20: qty 2

## 2023-11-20 MED ORDER — SODIUM CHLORIDE 0.9 % IV BOLUS
1000.0000 mL | Freq: Once | INTRAVENOUS | Status: AC
  Administered 2023-11-20: 1000 mL via INTRAVENOUS

## 2023-11-20 NOTE — ED Provider Notes (Signed)
 [facility] Provider Note    Event Date/Time   First MD Initiated Contact with Patient 11/20/23 [phone]     (approximate)   History   Abdominal Pain   HPI  Shelly Hamilton is a 33 y.o. female with no significant past medical history who presents to the emergency department with 1.5 years of abdominal pain.  She was recently diagnosed pregnant by urine pregnancy test at bedside OB/GYN where she had STD testing as well completed along with BV which did come back positive.  She recently just finished a course of antibiotics for BV and UTI 48 hours ago.  She is a G3 P1011.  Denies any nausea or vomiting, changes in urinary or bowel habits, worsening abdominal pain, vaginal bleeding.  She does have planned follow-up with [facility] already scheduled      Physical Exam   Triage Vital Signs: ED Triage Vitals [11/20/23 0625]  Encounter Vitals Group     BP 125/75     Girls Systolic BP Percentile      Girls Diastolic BP Percentile      Boys Systolic BP Percentile      Boys Diastolic BP Percentile      Pulse Rate 84     Resp 16     Temp 98.3 F (36.8 C)     Temp Source Oral     SpO2 100 %     Weight 135 lb (61.2 kg)     Height 5' 3 (1.6 m)     Head Circumference      Peak Flow      Pain Score 8     Pain Loc      Pain Education      Exclude from Growth Chart     Most recent vital signs: Vitals:   11/20/23 0745 11/20/23 1016  BP: (!) 94/55 111/69  Pulse: 75 74  Resp: 18 18  Temp:    SpO2: 100% 100%    Nursing Triage Note reviewed. Vital signs reviewed and patients oxygen saturation is normoxic  General: Patient is well nourished, well developed, awake and alert, resting comfortably in no acute distress Head: Normocephalic and atraumatic Eyes: Normal inspection, extraocular muscles intact, no conjunctival pallor Ear, nose, throat: Normal external exam Neck: Normal range of motion Respiratory: Patient is in no respiratory distress, lungs  CTAB Cardiovascular: Patient is not tachycardic, RRR without murmur appreciated GI: Abd Soft, very mild ttp in RLQ with no guarding or rebound. No CVA ttp Back: Normal inspection of the back with good strength and range of motion throughout all ext Extremities: pulses intact with good cap refills, no LE pitting edema or calf tenderness Neuro: The patient is alert and oriented to person, place, and time, appropriately conversive, with 5/5 bilat UE/LE strength, no gross motor or sensory defects noted. Coordination appears to be adequate. Skin: Warm, dry, and intact Psych: normal mood and affect, no SI or HI  ED Results / Procedures / Treatments   Labs (all labs ordered are listed, but only abnormal results are displayed) Labs Reviewed  COMPREHENSIVE METABOLIC PANEL WITH GFR - Abnormal; Notable for the following components:      Result Value   Potassium 3.4 (*)    Glucose, Bld 104 (*)    Alkaline Phosphatase 28 (*)    All other components within normal limits  CBC - Abnormal; Notable for the following components:   RBC 3.42 (*)    Hemoglobin 8.7 (*)  HCT 26.2 (*)    MCV 76.6 (*)    MCH 25.4 (*)    RDW 17.3 (*)    All other components within normal limits  URINALYSIS, ROUTINE W REFLEX MICROSCOPIC - Abnormal; Notable for the following components:   Color, Urine YELLOW (*)    APPearance HAZY (*)    All other components within normal limits  HCG, QUANTITATIVE, PREGNANCY - Abnormal; Notable for the following components:   hCG, Beta Chain, Quant, S 70,400 (*)    All other components within normal limits  POC URINE PREG, ED - Abnormal; Notable for the following components:   Preg Test, Ur POSITIVE (*)    All other components within normal limits  WET PREP, GENITAL  CHLAMYDIA/NGC RT PCR ([facility] ONLY)            LIPASE, BLOOD     EKG None  RADIOLOGY US  OB: Intrauterine pregnancy of 7 weeks 3 days and no ovarian abnormality on independent review interpretation and radiologist  agrees    PROCEDURES:  Critical Care performed: No  Procedures   MEDICATIONS ORDERED IN ED: Medications  acetaminophen  (TYLENOL ) tablet 1,000 mg (1,000 mg Oral Given 11/20/23 0743)  sodium chloride  0.9 % bolus 1,000 mL (0 mLs Intravenous Stopped 11/20/23 1020)     IMPRESSION / MDM / ASSESSMENT AND PLAN / ED COURSE                                Differential diagnosis includes, but is not limited to, pregnancy, anemia, electrolyte, recurrent BV, less likely ectopic   ED course: Patient is extremely well-appearing and abdomen demonstrates no evidence of peritonitis.  Given chronicity of symptoms, doubt pain is secondary to acute intra-abdominal issue but will add intra-abdominal labs including LFTs and lipase.  Her urine pregnancy test is positive but she does not have a leukocytosis.  She has self swabbed again for BV and reassured that her GC was negative at her OB/GYN's office previously.  Disposition pending OB ultrasound   Clinical Course as of 11/20/23 1553  Tue Nov 20, 2023  0702 Preg Test, Ur(!): POSITIVE Positive.  [HD]  0718 Wet prep, genital Wet prep is negative [HD]  0719 WBC: 8.7 No leukocytosis [HD]  0719 Hemoglobin(!): 8.7 Borderline anemia will discuss with the patient following up with OBG to determine whether or if iron pills are necessary [HD]  0756 Urinalysis, Routine w reflex microscopic -Urine, Clean Catch(!) Not consistent with UTI [HD]  0756 Comprehensive metabolic panel(!) No elevation of LFTs or profound electrolyte derangements [HD]  0951 US  OB LESS THAN 14 WEEKS W/ OB TRANSVAGINAL AND DOPPLER Intrauterine pregnancy.  No obvious other abnormality.  Will work on discharge [HD]  1008 Patient reassessed, repeat abdominal exam benign.  Has tolerated p.o.  Feels comfortable returning home.  Return precautions discussed [HD]    Clinical Course User Index [HD] Nicholaus Rolland BRAVO, MD   At time of discharge there is no evidence of acute life, limb, vision,  or fertility threat. Patient has stable vital signs, pain is well controlled, patient is ambulatory and p.o. tolerant.  Discharge instructions were completed using the [employer] system. I would refer you to those at this time. All warnings prescriptions follow-up etc. were discussed in detail with the patient. Patient indicates understanding and is agreeable with this plan. All questions answered.  Patient is made aware that they may return to the emergency department for any worsening or  new condition or for any other emergency. == Risk: 5 This patient has a high risk of morbidity due to further diagnostic testing or treatment. Rationale: This patient's evaluation and management involve a high risk of morbidity due to the potential severity of presenting symptoms, need for diagnostic testing, and/or initiation of treatment that may require close monitoring. The differential includes conditions with potential for significant deterioration or requiring escalation of care. Treatment decisions in the ED, including medication administration, procedural interventions, or disposition planning, reflect this level of risk. Additional Support: -- Drug therapy requiring intensive monitoring for toxicity [ ]  -- Decision regarding elective major surgery with idenitified patient or procedure risk factors [ ]  -- Decision regarding hospitalization or escalation of hospital-level care [ ]  -- Decision not to resuscitate or to de-escalate care because of poor prognosis [ ]  -- Parental controlled substances [ ]   COPA: 5 The patient has a severe exacerbation, progression, or side effect of treatment of the following illness/illnesses: []  OR  The patient has the following acute or chronic illness/injury that poses a possible threat to life or bodily function: [X] : The patient has a potentially serious acute condition or an acute exacerbation of a chronic illness requiring urgent evaluation and management in the Emergency  Department. The clinical presentation necessitates immediate consideration of life-threatening or function-threatening diagnoses, even if they are ultimately ruled out.  Data(2/3 categories following were performed): 5 I reviewed or ordered at least three unique tests, external notes, and/or the history required an independent historian as one of the three requirements as following: CBC, CMP, pregnancy test AND  I independently interpreted the following test: Ultrasound OB pregnancy OR  I discussed the management of the patient with the following external physician or qualified healthcare provider: []   Suggested E/M Coding Level: 5, 99285, This has been selected based on the 10-Dec-2021 CPT guidelines for E/M codes in the Emergency Department based on 2/3 of the CoPA, Data, and Risk.   FINAL CLINICAL IMPRESSION(S) / ED DIAGNOSES   Final diagnoses:  Right lower quadrant abdominal pain  Intrauterine pregnancy  Anemia, unspecified type     Rx / DC Orders   ED Discharge Orders     None        Note:  This document was prepared using Dragon voice recognition software and may include unintentional dictation errors.   Nicholaus Rolland BRAVO, MD 11/20/23 [phone]

## 2023-11-20 NOTE — ED Triage Notes (Signed)
 Patient ambulatory to triage with complaints of right lower quadrant abdominal pain. Patient states she was just recently treated for BV and UTI, figured it would go away with treatment but it did not. Finished antibiotics 8/25. Patient is currently approx [redacted] weeks pregnant, due 07/04/24.

## 2023-11-20 NOTE — ED Provider Notes (Signed)
 Emergency Medicine Provider Triage Evaluation Note  Shelly Hamilton , a 33 y.o. female  was evaluated in triage.  Pt complains of right lower quadrant pain for a year and a half.  Patient currently 7 weeks, 4 days pregnant based on LMP.  Review of Systems  Positive: Right lower quadrant pain Negative: Vaginal bleeding, discharge, dysuria  Physical Exam  BP 125/75   Pulse 84   Temp 98.3 F (36.8 C) (Oral)   Resp 16   Ht 5' 3 (1.6 m)   Wt 61.2 kg   LMP 09/28/2023   SpO2 100%   BMI 23.91 kg/m  Gen:   Awake, no distress   Resp:  Normal effort  MSK:   Moves extremities without difficulty  Other:  Abdominal exam benign, no guarding or rebound  Medical Decision Making  Medically screening exam initiated at 6:39 AM.  Appropriate orders placed.  Shelly Hamilton was informed that the remainder of the evaluation will be completed by another provider, this initial triage assessment does not replace that evaluation, and the importance of remaining in the ED until their evaluation is complete.  Patient here for right lower quadrant pain ongoing for a year and a half, worsens around her menstrual cycle.  Currently pregnant.  G3 P1011.  Followed by [facility].  States she has never had any imaging including ultrasound for further evaluation.  No family history of endometriosis.  No current urinary symptoms.  No vaginal bleeding or discharge.  States she recently finished antibiotics for UTI and BV but symptoms did not improve.  Is requesting STI screening again today.  Labs, urine, pelvic swabs, transvaginal ultrasound with Doppler, Tylenol  ordered.   [name], [name], DO 11/20/23 [phone]

## 2023-11-20 NOTE — ED Notes (Signed)
 Pt verbalizes understanding of discharge instructions. Opportunity for questioning and answers were provided. Pt discharged from ED to home.   ? ?

## 2023-11-20 NOTE — Discharge Instructions (Signed)
 You were seen in the emergency department for several months of abdominal pain.  Workup today was reassuring as I do not see evidence of a urinary tract infection or recurrent bacterial vaginosis.  You were mildly anemic which can happen during pregnancy.  Please make sure you take a prenatal vitamin every day and talk to your OB/GYN on whether you need to initiate iron pills.  Ensure adequate hydration.  Return with any acutely worse symptoms -- RETURN PRECAUTIONS & AFTERCARE: (ENGLISH) RETURN PRECAUTIONS: Return immediately to the emergency department or see/call your doctor if you feel worse, weak or have changes in speech or vision, are short of breath, have fever, vomiting, pain, bleeding or dark stool, trouble urinating or any new issues. Return here or see/call your doctor if not improving as expected for your suspected condition. FOLLOW-UP CARE: Call your doctor and/or any doctors we referred you to for more advice and to make an appointment. Do this today, tomorrow or after the weekend. Some doctors only take PPO insurance so if you have HMO insurance you may want to contact your HMO or your regular doctor for referral to a specialist within your plan. Either way tell the doctor's office that it was a referral from the emergency department so you get the soonest possible appointment.  YOUR TEST RESULTS: Take result reports of any blood or urine tests, imaging tests and EKG's to your doctor and any referral doctor. Have any abnormal tests repeated. Your doctor or a referral doctor can let you know when this should be done. Also make sure your doctor contacts this hospital to get any test results that are not currently available such as cultures or special tests for infection and final imaging reports, which are often not available at the time you leave the ER but which may list additional important findings that are not documented on the preliminary report. BLOOD PRESSURE: If your blood pressure was  greater than 120/80 have your blood pressure rechecked within 1 to 2 weeks. MEDICATION SIDE EFFECTS: Do not drive, walk, bike, take the bus, etc. if you have received or are being prescribed any sedating medications such as those for pain or anxiety or certain antihistamines like Benadryl. If you have been give one of these here get a taxi home or have a friend drive you home. Ask your pharmacist to counsel you on potential side effects of any new medication

## 2023-11-26 ENCOUNTER — Emergency Department
Admission: EM | Admit: 2023-11-26 | Discharge: 2023-11-26 | Disposition: A | Discharge status: Home / Self Care | Attending: Emergency Medicine | Admitting: Emergency Medicine

## 2023-11-26 ENCOUNTER — Emergency Department

## 2023-11-26 ENCOUNTER — Other Ambulatory Visit: Payer: Self-pay

## 2023-11-26 DIAGNOSIS — R112 Nausea with vomiting, unspecified: Secondary | ICD-10-CM | POA: Insufficient documentation

## 2023-11-26 DIAGNOSIS — Z3A08 8 weeks gestation of pregnancy: Secondary | ICD-10-CM | POA: Insufficient documentation

## 2023-11-26 DIAGNOSIS — R1032 Left lower quadrant pain: Secondary | ICD-10-CM | POA: Diagnosis present

## 2023-11-26 DIAGNOSIS — O208 Other hemorrhage in early pregnancy: Secondary | ICD-10-CM

## 2023-11-26 DIAGNOSIS — O4691 Antepartum hemorrhage, unspecified, first trimester: Secondary | ICD-10-CM | POA: Diagnosis not present

## 2023-11-26 LAB — COMPREHENSIVE METABOLIC PANEL WITH GFR
ALT: 7 U/L (ref 0–44)
AST: 17 U/L (ref 15–41)
Albumin: 4.1 g/dL (ref 3.5–5.0)
Alkaline Phosphatase: 32 U/L — ABNORMAL LOW (ref 38–126)
Anion gap: 8 (ref 5–15)
BUN: 7 mg/dL (ref 6–20)
CO2: 23 mmol/L (ref 22–32)
Calcium: 9.1 mg/dL (ref 8.9–10.3)
Chloride: 105 mmol/L (ref 98–111)
Creatinine, Ser: 0.66 mg/dL (ref 0.44–1.00)
GFR, Estimated: >60 mL/min (ref >60)
Glucose, Bld: 98 mg/dL (ref 70–99)
Potassium: 3.6 mmol/L (ref 3.5–5.1)
Sodium: 136 mmol/L (ref 135–145)
Total Bilirubin: 0.5 mg/dL (ref 0.0–1.2)
Total Protein: 7.2 g/dL (ref 6.5–8.1)

## 2023-11-26 LAB — URINALYSIS, ROUTINE W REFLEX MICROSCOPIC
Bacteria, UA: NONE SEEN
Bilirubin Urine: NEGATIVE
Glucose, UA: NEGATIVE mg/dL
Ketones, ur: NEGATIVE mg/dL
Leukocytes,Ua: NEGATIVE
Nitrite: NEGATIVE
Protein, ur: 30 mg/dL — AB
Specific Gravity, Urine: 1.019 (ref 1.005–1.030)
pH: 5 (ref 5.0–8.0)

## 2023-11-26 LAB — CBC
HCT: 28.9 % — ABNORMAL LOW (ref 36.0–46.0)
Hemoglobin: 9.7 g/dL — ABNORMAL LOW (ref 12.0–15.0)
MCH: 25.6 pg — ABNORMAL LOW (ref 26.0–34.0)
MCHC: 33.6 g/dL (ref 30.0–36.0)
MCV: 76.3 fL — ABNORMAL LOW (ref 80.0–100.0)
Platelets: 363 K/uL (ref 150–400)
RBC: 3.79 MIL/uL — ABNORMAL LOW (ref 3.87–5.11)
RDW: 17.8 % — ABNORMAL HIGH (ref 11.5–15.5)
WBC: 7.8 K/uL (ref 4.0–10.5)
nRBC: 0 % (ref 0.0–0.2)

## 2023-11-26 LAB — HCG, QUANTITATIVE, PREGNANCY: hCG, Beta Chain, Quant, S: 111610 m[IU]/mL — ABNORMAL HIGH (ref <5)

## 2023-11-26 LAB — ABO/RH: ABO/RH(D): O POS

## 2023-11-26 NOTE — Discharge Instructions (Signed)
 Please follow-up with your OB.  Return to the emergency department for any symptom concerning to yourself.

## 2023-11-26 NOTE — ED Triage Notes (Signed)
 Pt reports left lower abdominal pain since tonight. Vomited x 2 tonight. When she wiped, she noticed pinkish color discharge.  Denies fevers. + preg,  EDD 07/04/24

## 2023-11-26 NOTE — ED Provider Notes (Signed)
   [facility] Provider Note    Event Date/Time   First MD Initiated Contact with Patient 11/26/23 0210     (approximate)  History   Chief Complaint: Abdominal Pain  HPI  Shelly Hamilton is a 33 y.o. female G3P1A1 presents to the emergency department approximately [redacted] weeks pregnant for lower abdominal discomfort nausea and vomiting.  Patient states she is approximately [redacted] weeks pregnant has had nausea intermittently.  Tonight she noticed a pink discharge when she wiped and has been experiencing some lower abdominal cramping.  Denies any urinary symptoms.  No fever.  Physical Exam   Triage Vital Signs: ED Triage Vitals  Encounter Vitals Group     BP 11/26/23 0148 105/72     Girls Systolic BP Percentile --      Girls Diastolic BP Percentile --      Boys Systolic BP Percentile --      Boys Diastolic BP Percentile --      Pulse Rate 11/26/23 0148 (!) 58     Resp 11/26/23 0148 16     Temp 11/26/23 0148 98.2 F (36.8 C)     Temp Source 11/26/23 0148 Oral     SpO2 11/26/23 0148 99 %     Weight --      Height --      Head Circumference --      Peak Flow --      Pain Score 11/26/23 0147 5     Pain Loc --      Pain Education --      Exclude from Growth Chart --     Most recent vital signs: Vitals:   11/26/23 0148  BP: 105/72  Pulse: (!) 58  Resp: 16  Temp: 98.2 F (36.8 C)  SpO2: 99%    General: Awake, no distress.  CV:  Good peripheral perfusion.  Regular rate and rhythm  Resp:  Normal effort.  Equal breath sounds bilaterally.  Abd:  No distention.  Soft, nontender.  No rebound or guarding.  Benign abdomen.  ED Results / Procedures / Treatments   RADIOLOGY  Ultrasound shows live IUP at 8 weeks 1 day with cardiac activity.  Moderate subchorionic hemorrhage.   MEDICATIONS ORDERED IN ED: Medications - No data to display   IMPRESSION / MDM / ASSESSMENT AND PLAN / ED COURSE  I reviewed the triage vital signs and the nursing notes.  Patient's  presentation is most consistent with acute presentation with potential threat to life or bodily function.  Patient presents emergency department for abdominal cramping and pink discharge upon wiping tonight.  Patient states she is approximately [redacted] weeks pregnant.  Patient's lab work today shows a reassuring CBC reassuring chemistry reassuring urinalysis besides slight amount of red cells.  We will obtain an ABO/Rh, quantitative beta-hCG and obtain an ultrasound to further evaluate.  Patient agreeable to plan of care.  Patient's workup is overall reassuring.  Lab work shows reassuring CBC reassuring chemistry with a beta-hCG of 111,000.  Urinalysis shows no concerning findings.  Patient's ultrasound shows a single live IUP 8 weeks and 1 day with a small to moderate subchorionic hemorrhage likely the cause for the bleeding.  Will have patient follow-up with OB.  Patient agreeable to plan of care.  FINAL CLINICAL IMPRESSION(S) / ED DIAGNOSES   Subchorionic hemorrhage   Note:  This document was prepared using Dragon voice recognition software and may include unintentional dictation errors.   Dorothyann Drivers, MD 11/26/23 [phone]

## 2023-11-30 ENCOUNTER — Telehealth

## 2023-11-30 DIAGNOSIS — Z3480 Encounter for supervision of other normal pregnancy, unspecified trimester: Secondary | ICD-10-CM | POA: Insufficient documentation

## 2023-11-30 DIAGNOSIS — Z3689 Encounter for other specified antenatal screening: Secondary | ICD-10-CM

## 2023-11-30 DIAGNOSIS — Z98891 History of uterine scar from previous surgery: Secondary | ICD-10-CM | POA: Insufficient documentation

## 2023-11-30 DIAGNOSIS — R768 Other specified abnormal immunological findings in serum: Secondary | ICD-10-CM | POA: Insufficient documentation

## 2023-11-30 NOTE — Patient Instructions (Signed)
 First Trimester of Pregnancy  The first trimester of pregnancy starts on the first day of your last monthly period until the end of week 13. This is months 1 through 3 of pregnancy. A week after a sperm fertilizes an egg, the egg will implant into the wall of the uterus and begin to develop into a baby. Body changes during your first trimester Your body goes through many changes during pregnancy. The changes usually return to normal after your baby is born. Physical changes Your breasts may grow larger and may hurt. The area around your nipples may get darker. Your periods will stop. Your hair and nails may grow faster. You may pee more often. Health changes You may tire easily. Your gums may bleed and may be sensitive when you brush and floss. You may not feel hungry. You may have heartburn. You may throw up or feel like you may throw up. You may want to eat some foods, but not others. You may have headaches. You may have trouble pooping (constipation). Other changes Your emotions may change from day to day. You may have more dreams. Follow these instructions at home: Medicines Talk to your health care provider if you're taking medicines. Ask if the medicines are safe to take during pregnancy. Your provider may change the medicines that you take. Do not take any medicines unless told to by your provider. Take a prenatal vitamin that has at least 600 micrograms (mcg) of folic acid. Do not use herbal medicines, illegal substances, or medicines that are not approved by your provider. Eating and drinking While you're pregnant your body needs extra food for your growing baby. Talk with your provider about what to eat while pregnant. Activity Most women are able to exercise during pregnancy. Exercises may need to change as your pregnancy goes on. Talk to your provider about your activities and exercise routines. Relieving pain and discomfort Wear a good, supportive bra if your breasts  hurt. Rest with your legs raised if you have leg cramps or low back pain. Safety Wear your seatbelt at all times when you're in a car. Talk to your provider if someone hits you, hurts you, or yells at you. Talk with your provider if you're feeling sad or have thoughts of hurting yourself. Lifestyle Certain things can be harmful while you're pregnant. Follow these rules: Do not use hot tubs, steam rooms, or saunas. Do not douche. Do not use tampons or scented pads. Do not drink alcohol,smoke, vape, or use products with nicotine or tobacco in them. If you need help quitting, talk with your provider. Avoid cat litter boxes and soil used by cats. These things carry germs that can cause harm to your pregnancy and your baby. General instructions Keep all follow-up visits. It helps you and your unborn baby stay as healthy as possible. Write down your questions. Take them to your visits. Your provider will: Talk with you about your overall health. Give you advice or refer you to specialists who can help with different needs, including: Prenatal education classes. Mental health and counseling. Foods and healthy eating. Ask for help if you need help with food. Call your dentist and ask to be seen. Brush your teeth with a soft toothbrush. Floss gently. Where to find more information American Pregnancy Association: americanpregnancy.org [employer]: acog.org Office on [employer] Health: [employer] Contact a health care provider if: You feel dizzy, faint, or have a fever. You vomit or have watery poop (diarrhea) for 2  days or more. You have abnormal discharge or bleeding from your vagina. You have pain when you pee or your pee smells bad. You have cramps, pain, or pressure in your belly area. Get help right away if: You have trouble breathing or chest pain. You have any kind of injury, such as from a fall or a car crash. These symptoms may be an  emergency. Get help right away. Call 911. Do not wait to see if the symptoms will go away. Do not drive yourself to the hospital. This information is not intended to replace advice given to you by your health care provider. Make sure you discuss any questions you have with your health care provider. Document Revised: 12/07/2022 Document Reviewed: 07/07/2022 Elsevier Patient Education  2024 Elsevier Inc.   Common Medications Safe in Pregnancy  Acne:      Constipation:  Benzoyl Peroxide     Colace  Clindamycin      Dulcolax Suppository  Topica Erythromycin     Fibercon  Salicylic Acid      Metamucil         Miralax AVOID:        Senakot   Accutane    Cough:  Retin-A       Cough Drops  Tetracycline      Phenergan w/ Codeine if Rx  Minocycline      Robitussin (Plain & DM)  Antibiotics:     Crabs/Lice:  Ceclor       RID  Cephalosporins    AVOID:  E-Mycins      Kwell  Keflex  Macrobid/Macrodantin   Diarrhea:  Penicillin      Kao-Pectate  Zithromax      Imodium AD         PUSH FLUIDS AVOID:       Cipro     Fever:  Tetracycline      Tylenol (Regular or Extra  Minocycline       Strength)  Levaquin      Extra Strength-Do not          Exceed 8 tabs/24 hrs Caffeine:        200mg /day (equiv. To 1 cup of coffee or  approx. 3 12 oz sodas)         Gas: Cold/Hayfever:       Gas-X  Benadryl      Mylicon  Claritin       Phazyme  **Claritin-D        Chlor-Trimeton    Headaches:  Dimetapp      ASA-Free Excedrin  Drixoral-Non-Drowsy     Cold Compress  Mucinex (Guaifenasin)     Tylenol (Regular or Extra  Sudafed/Sudafed-12 Hour     Strength)  **Sudafed PE Pseudoephedrine   Tylenol Cold & Sinus     Vicks Vapor Rub  Zyrtec  **AVOID if Problems With Blood Pressure         Heartburn: Avoid lying down for at least 1 hour after meals  Aciphex      Maalox     Rash:  Milk of Magnesia     Benadryl    Mylanta       1% Hydrocortisone Cream  Pepcid  Pepcid Complete   Sleep  Aids:  Prevacid      Ambien   Prilosec       Benadryl  Rolaids       Chamomile Tea  Tums (Limit 4/day)     Unisom  Tylenol PM         Warm milk-add vanilla or  Hemorrhoids:       Sugar for taste  Anusol/Anusol H.C.  (RX: Analapram 2.5%)  Sugar Substitutes:  Hydrocortisone OTC     Ok in moderation  Preparation H      Tucks        Vaseline lotion applied to tissue with wiping    Herpes:     Throat:  Acyclovir      Oragel  Famvir  Valtrex     Vaccines:         Flu Shot Leg Cramps:       *Gardasil  Benadryl      Hepatitis A         Hepatitis B Nasal Spray:       Pneumovax  Saline Nasal Spray     Polio Booster         Tetanus Nausea:       Tuberculosis test or PPD  Vitamin B6 25 mg TID   AVOID:    Dramamine      *Gardasil  Emetrol       Live Poliovirus  Ginger Root 250 mg QID    MMR (measles, mumps &  High Complex Carbs @ Bedtime    rebella)  Sea Bands-Accupressure    Varicella (Chickenpox)  Unisom 1/2 tab TID     *No known complications           If received before Pain:         Known pregnancy;   Darvocet       Resume series after  Lortab        Delivery  Percocet    Yeast:   Tramadol      Femstat  Tylenol 3      Gyne-lotrimin  Ultram       Monistat  Vicodin           MISC:         All Sunscreens           Hair Coloring/highlights          Insect Repellant's          (Including DEET)         Mystic Tans   Commonly Asked Questions During Pregnancy   Cats: A parasite can be excreted in cat feces.  To avoid exposure you need to have another person empty the little box.  If you must empty the litter box you will need to wear gloves.  Wash your hands after handling your cat.  This parasite can also be found in raw or undercooked meat so this should also be avoided.  Colds, Sore Throats, Flu: Please check your medication sheet to see what you can take for symptoms.  If your symptoms are unrelieved by these medications please call the office.  Dental Work: Most  any dental work Agricultural consultant recommends is permitted.  X-rays should only be taken during the first trimester if absolutely necessary.  Your abdomen should be shielded with a lead apron during all x-rays.  Please notify your provider prior to receiving any x-rays.  Novocaine is fine; gas is not recommended.  If your dentist requires a note from [place] prior to dental work please call the office and we will provide one for you.  Exercise: Exercise is an important part of staying healthy during your pregnancy.  You may continue most exercises you were accustomed to prior to pregnancy.  Later in your pregnancy you will most likely notice you have difficulty with activities requiring balance like riding a bicycle.  It is important that you listen to your body and avoid activities that put you at a higher risk of falling.  Adequate rest and staying well hydrated are a must!  If you have questions about the safety of specific activities ask your provider.    Exposure to Children with illness: Try to avoid obvious exposure; report any symptoms to [place] when noted,  If you have chicken pos, red measles or mumps, you should be immune to these diseases.   Please do not take any vaccines while pregnant unless you have checked with your OB provider.  Fetal Movement: After 28 weeks we recommend you do "kick counts" twice daily.  Lie or sit down in a calm quiet environment and count your baby movements "kicks".  You should feel your baby at least 10 times per hour.  If you have not felt 10 kicks within the first hour get up, walk around and have something sweet to eat or drink then repeat for an additional hour.  If count remains less than 10 per hour notify your provider.  Fumigating: Follow your pest control agent's advice as to how long to stay out of your home.  Ventilate the area well before re-entering.  Hemorrhoids:   Most over-the-counter preparations can be used during pregnancy.  Check your medication to see what is  safe to use.  It is important to use a stool softener or fiber in your diet and to drink lots of liquids.  If hemorrhoids seem to be getting worse please call the office.   Hot Tubs:  Hot tubs Jacuzzis and saunas are not recommended while pregnant.  These increase your internal body temperature and should be avoided.  Intercourse:  Sexual intercourse is safe during pregnancy as long as you are comfortable, unless otherwise advised by your provider.  Spotting may occur after intercourse; report any bright red bleeding that is heavier than spotting.  Labor:  If you know that you are in labor, please go to the hospital.  If you are unsure, please call the office and let us help you decide what to do.  Lifting, straining, etc:  If your job requires heavy lifting or straining please check with your provider for any limitations.  Generally, you should not lift items heavier than that you can lift simply with your hands and arms (no back muscles)  Painting:  Paint fumes do not harm your pregnancy, but may make you ill and should be avoided if possible.  Latex or water based paints have less odor than oils.  Use adequate ventilation while painting.  Permanents & Hair Color:  Chemicals in hair dyes are not recommended as they cause increase hair dryness which can increase hair loss during pregnancy.  " Highlighting" and permanents are allowed.  Dye may be absorbed differently and permanents may not hold as well during pregnancy.  Sunbathing:  Use a sunscreen, as skin burns easily during pregnancy.  Drink plenty of fluids; avoid over heating.  Tanning Beds:  Because their possible side effects are still unknown, tanning beds are not recommended.  Ultrasound Scans:  Routine ultrasounds are performed at approximately 20 weeks.  You will be able to see your baby's general anatomy an if you would like to know the gender this can usually be determined as well.  If it is questionable when you conceived you may  also  receive an ultrasound early in your pregnancy for dating purposes.  Otherwise ultrasound exams are not routinely performed unless there is a medical necessity.  Although you can request a scan we ask that you pay for it when conducted because insurance does not cover " patient request" scans.  Work: If your pregnancy proceeds without complications you may work until your due date, unless your physician or employer advises otherwise.  Round Ligament Pain/Pelvic Discomfort:  Sharp, shooting pains not associated with bleeding are fairly common, usually occurring in the second trimester of pregnancy.  They tend to be worse when standing up or when you remain standing for long periods of time.  These are the result of pressure of certain pelvic ligaments called "round ligaments".  Rest, Tylenol and heat seem to be the most effective relief.  As the womb and fetus grow, they rise out of the pelvis and the discomfort improves.  Please notify the office if your pain seems different than that described.  It may represent a more serious condition.

## 2023-11-30 NOTE — Progress Notes (Signed)
 New OB Intake  I connected with  Shelly Hamilton on 11/30/23 at  8:15 AM EDT by MyChart Video Visit and verified that I am speaking with the correct person using two identifiers. Nurse is located at [facility] and pt is located at home.  I discussed the limitations, risks, security and privacy concerns of performing an evaluation and management service by telephone and the availability of in person appointments. I also discussed with the patient that there may be a patient responsible charge related to this service. The patient expressed understanding and agreed to proceed.  I explained I am completing New OB Intake today. We discussed her EDD of 07/04/24 that is based on LMP of 11/30/23. Pt is G3/P1. I reviewed her allergies, medications, Medical/Surgical/OB history, and appropriate screenings. There are cats in the home: no. Based on history, this is a/an pregnancy uncomplicated . Her obstetrical history is significant for N/A.  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  Anatomy US  Explained Anatomy US  will be scheduled around [redacted] weeks gestational age.  Labs Discussed genetic screening with patient. Patient desires genetic testing to be drawn at new OB visit. Discussed possible labs to be drawn at new OB appointment.  COVID Vaccine Patient has not had COVID vaccine.   Social Determinants of Health Food Insecurity: denies food insecurity  Transportation: Patient denies transportation needs. Childcare: Discussed no children allowed at ultrasound appointments.   First visit review I reviewed new OB appt with pt. I explained she will have blood work and pap smear/pelvic exam if indicated. Explained pt will be seen by Harland Birkenhead, MD at first visit;  encounter routed to appropriate provider.   Beola Skeens, CMA 11/30/2023  8:45 AM

## 2023-12-12 NOTE — Telephone Encounter (Signed)
 TC to [facility] to review symptoms. Reports RLQ abdominal pain, happens randomly, lasting for 5-63m sometimes and sometimes is constant. Pain is below umbilicus, above hip bone. She has previously experienced this pain in the week prior to her menses (less severe) and thought it could be r/t cycle. She has been treated for a UTI this pregnancy without relief of discomfort. Denies fever, chills, reports decreased appetite d/t pain. Ultrasound reports from 9/2 & 9/8 reviewed, 9/2-no visualization of ovaries, 9/8-bilateral ovaries of expected size & without cysts. Subchorionic hemorrhage noted. Reports normal BM, denies dysuria. Pain is bad enough that it will wake her out of sleep. D/W M[place]hia this could be r/t growing uterus, however pain occurs without activity. Reviewed given prior C/S there could be scar tissue that is affected by pregnancy. She has an ultrasound scheduled 9/25, discussed that scar tissue is not visible on u/s but to identify area of pain during u/s to sonographer in order to gain any useful information available. Berwyn Hummer, RDMS notified. D/w Dr. Leigh, in agreement with current plan, this is not c/w acute infectious process, if pain persists could consider higher level imaging, if it is adhesions/scar tissue pain may resolve as uterus grows.

## 2023-12-13 ENCOUNTER — Ambulatory Visit

## 2023-12-13 DIAGNOSIS — O3680X0 Pregnancy with inconclusive fetal viability, not applicable or unspecified: Secondary | ICD-10-CM

## 2023-12-13 DIAGNOSIS — Z3A10 10 weeks gestation of pregnancy: Secondary | ICD-10-CM | POA: Diagnosis not present

## 2023-12-31 NOTE — Progress Notes (Unsigned)
 NEW OB HISTORY AND PHYSICAL  SUBJECTIVE:       Shelly Hamilton is a 33 y.o. G47P1011 female, Patient's last menstrual period was 09/28/2023 (exact date)., Estimated Date of Delivery: 07/04/24, [redacted]w[redacted]d, presents today for establishment of Prenatal Care. She reports headache that has been there for 5 days. She was seen in ER today and received tylenol , IV fluids, and reglan. The headache is somewhat improved, but persists. It is above her left eyebrow and has been in that location since it started. She does have some sinus pressure. 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 She usually treats with [facility] powder (effective for her), but has not used that in pregnancy.   Social history Partner/Relationship: Evalene Ash- lives locally. Has 3 daughters. Living situation: Lives in [place] with her 35 yo Son. 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 Normal Contraception: none Last Pap: unsure. 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     Prenatal Vit-Fe Fumarate-FA (PRENATAL PO) Take by mouth.     No current facility-administered medications on file prior to visit.    No Known  Allergies  Social History   Socioeconomic History   Marital status: Significant Other    Spouse name: Not on file   Number of children: 1   Years of education: 10   Highest education level: Some college, no degree  Occupational History   Occupation: [employer]   Occupation: Unemployed  Tobacco Use   Smoking status: Former    Current packs/day: 0.00    Types: Cigarettes    Quit date: 05/19/2015    Years since quitting: 8.6    Passive exposure: Current   Smokeless tobacco: Never  Vaping Use   Vaping status: Former   Substances: Nicotine  Substance and Sexual Activity   Alcohol use: Not Currently    Comment: occ   Drug use: Not Currently    Types: Marijuana    Comment: Occassionally   Sexual activity: Yes    Partners: Male    Birth control/protection: None  Other Topics Concern   Not on file  Social History Narrative   Not on file   Social Drivers of Health   Financial Resource Strain: Low Risk  (11/30/2023)   Overall Financial Resource Strain (CARDIA)    Difficulty of Paying Living Expenses: Not hard at all  Food Insecurity: No Food Insecurity (11/30/2023)   Hunger Vital Sign    Worried About  Running Out of Food in the Last Year: Never true    Ran Out of Food in the Last Year: Never true  Transportation Needs: No Transportation Needs (11/30/2023)   PRAPARE - Administrator, Civil Service (Medical): No    Lack of Transportation (Non-Medical): No  Physical Activity: Sufficiently Active (11/30/2023)   Exercise Vital Sign    Days of Exercise per Week: 7 days    Minutes of Exercise per Session: 30 min  Stress: No Stress Concern Present (11/30/2023)   [employer] of Occupational Health - Occupational Stress Questionnaire    Feeling of Stress: Only a little  Social Connections: Moderately Isolated (11/30/2023)   Social Connection and Isolation Panel    Frequency of Communication with Friends and Family: More than three times a week    Frequency of Social  Gatherings with Friends and Family: Three times a week    Attends Religious Services: More than 4 times per year    Active Member of Clubs or Organizations: No    Attends Banker Meetings: Not on file    Marital Status: Never married  Intimate Partner Violence: Not At Risk (11/30/2023)   Humiliation, Afraid, Rape, and Kick questionnaire    Fear of Current or Ex-Partner: No    Emotionally Abused: No    Physically Abused: No    Sexually Abused: No    Family History  Problem Relation Age of Onset   Thyroid cancer Maternal Grandmother     The following portions of the patient's history were reviewed and updated as appropriate: allergies, current medications, past OB history, past medical history, past surgical history, past family history, past social history, and problem list.  Constitutional: Denied constitutional symptoms, night sweats, recent illness, fatigue, fever, insomnia and weight loss.  Eyes: Denied eye symptoms, eye pain, photophobia, vision change and visual disturbance.  Ears/Nose/Throat/Neck: Denied ear, nose, throat or neck symptoms, hearing loss, nasal discharge, sinus congestion and sore throat.  Cardiovascular: Denied cardiovascular symptoms, arrhythmia, chest pain/pressure, edema, exercise intolerance, orthopnea and palpitations.  Respiratory: Denied pulmonary symptoms, asthma, pleuritic pain, productive sputum, cough, dyspnea and wheezing.  Gastrointestinal: Denied gastro-esophageal reflux, melena, nausea and vomiting.  Genitourinary: Denied genitourinary symptoms including symptomatic vaginal discharge, pelvic relaxation issues, and urinary complaints.  Musculoskeletal: Denied musculoskeletal symptoms, stiffness, swelling, muscle weakness and myalgia.  Dermatologic: Denied dermatology symptoms, rash and scar.  Neurologic: Denied neurology symptoms, dizziness, headache, neck pain and syncope.  Psychiatric: Denied psychiatric symptoms, anxiety and depression.   Endocrine: Denied endocrine symptoms including hot flashes and night sweats.    Indications for ASA therapy (per uptodate) One of the following: Previous pregnancy with preeclampsia, especially early onset and with an adverse outcome Yes- unclear GHTN vs prex w/o SF in 2014 Multifetal gestation No Chronic hypertension No Type 1 or 2 diabetes mellitus No Chronic kidney disease No Autoimmune disease (antiphospholipid syndrome, systemic lupus erythematosus) No  Two or more of the following: Nulliparity No Obesity (body mass index >30 kg/m2) No Family history of preeclampsia in mother or sister No Age >=35 years No Sociodemographic characteristics (African American race, low socioeconomic level) Yes Personal risk factors (eg, previous pregnancy with low birth weight or small for gestational age infant, previous adverse pregnancy outcome [eg, stillbirth], interval >10 years between pregnancies) Yes   OBJECTIVE: Initial Physical Exam (New OB)  GENERAL APPEARANCE: alert, well appearing, in no apparent distress, oriented to person, place and time, well hydrated HEAD: normocephalic, atraumatic MOUTH: mucous membranes moist, pharynx normal  without lesions THYROID: no thyromegaly or masses present BREASTS: no masses noted, no significant tenderness, no palpable axillary nodes, no skin changes, bilateral nipple piercings LUNGS: clear to auscultation, no wheezes, rales or rhonchi, symmetric air entry HEART: regular rate and rhythm, no murmurs ABDOMEN: soft, nontender, nondistended, no abnormal masses, no epigastric pain EXTREMITIES: no redness or tenderness in the calves or thighs SKIN: normal coloration and turgor, no rashes LYMPH NODES: no adenopathy palpable NEUROLOGIC: alert, oriented, normal speech, no focal findings or movement disorder noted  PELVIC EXAM EXTERNAL GENITALIA: normal appearing vulva with no masses, tenderness or lesions VAGINA: no abnormal discharge or lesions CERVIX:  no lesions or cervical motion tenderness and not digitally examined UTERUS: gravid and consistent with 14-15 weeks  ASSESSMENT: Normal pregnancy   PLAN: Chronic tension type headache Hx chronic headache AND migraine with visual aura Given regimen for prophylaxis: Vitamin B2 400mcg every day Magnesium Oxide 400-500mg  every day Co-enzyme Q10 100 mg bid Also discussed saline nasal spray, tylenol , caffeine, occasional use of sudafed if seems sinus- related.  History of C-section 12/11/12: Labored c-section- small narrow pelvis 10lbs 3oz. Small mid T of incision, crusted HSV lesions (unsure if primary outbreak). Op note indicates concern for CPD as well as HSV outbreak as indications for c/s.  Reviewed with patient. Discussed recommended repeat c-section given T incision. Likely timing 38-39 wks.   HSV-2 seropositive We did not discuss today as patient's partner was in the exam room and he is unaware of this result. Plan valtrex prophylaxis at 36 wks  History of gestational hypertension Unclear if GHTN vs prex [ ] Baseline prex labs [ ] ASA 81mg      Routine prenatal care. We discussed an overview of prenatal care and when to call. Reviewed diet, exercise, and weight gain recommendations in pregnancy. Discussed benefits of breastfeeding and lactation resources at [facility]. I answered all questions. Labs and genetic screening today.  See orders  Lauraine Lakes, CNM

## 2024-01-01 ENCOUNTER — Encounter: Admitting: Obstetrics & Gynecology

## 2024-01-01 ENCOUNTER — Encounter: Admitting: Obstetrics and Gynecology

## 2024-01-01 ENCOUNTER — Encounter: Admitting: Obstetrics

## 2024-01-02 ENCOUNTER — Ambulatory Visit (INDEPENDENT_AMBULATORY_CARE_PROVIDER_SITE_OTHER): Admitting: Registered Nurse

## 2024-01-02 ENCOUNTER — Other Ambulatory Visit (HOSPITAL_COMMUNITY)
Admission: RE | Admit: 2024-01-02 | Discharge: 2024-01-02 | Disposition: A | Discharge status: Home / Self Care | Source: Ambulatory Visit | Attending: Registered Nurse | Admitting: Registered Nurse

## 2024-01-02 ENCOUNTER — Other Ambulatory Visit: Payer: Self-pay

## 2024-01-02 ENCOUNTER — Emergency Department

## 2024-01-02 ENCOUNTER — Emergency Department
Admission: EM | Admit: 2024-01-02 | Discharge: 2024-01-02 | Disposition: A | Discharge status: Home / Self Care | Attending: Emergency Medicine | Admitting: Emergency Medicine

## 2024-01-02 ENCOUNTER — Encounter: Payer: Self-pay | Admitting: Registered Nurse

## 2024-01-02 VITALS — BP 108/68 | HR 71 | Wt 132.7 lb

## 2024-01-02 DIAGNOSIS — O26891 Other specified pregnancy related conditions, first trimester: Secondary | ICD-10-CM | POA: Insufficient documentation

## 2024-01-02 DIAGNOSIS — R0789 Other chest pain: Secondary | ICD-10-CM | POA: Diagnosis not present

## 2024-01-02 DIAGNOSIS — R7689 Other specified abnormal immunological findings in serum: Secondary | ICD-10-CM

## 2024-01-02 DIAGNOSIS — Z113 Encounter for screening for infections with a predominantly sexual mode of transmission: Secondary | ICD-10-CM | POA: Diagnosis present

## 2024-01-02 DIAGNOSIS — Z3A13 13 weeks gestation of pregnancy: Secondary | ICD-10-CM | POA: Diagnosis present

## 2024-01-02 DIAGNOSIS — Z3481 Encounter for supervision of other normal pregnancy, first trimester: Secondary | ICD-10-CM | POA: Diagnosis not present

## 2024-01-02 DIAGNOSIS — Z98891 History of uterine scar from previous surgery: Secondary | ICD-10-CM

## 2024-01-02 DIAGNOSIS — Z8759 Personal history of other complications of pregnancy, childbirth and the puerperium: Secondary | ICD-10-CM | POA: Insufficient documentation

## 2024-01-02 DIAGNOSIS — Z3480 Encounter for supervision of other normal pregnancy, unspecified trimester: Secondary | ICD-10-CM | POA: Diagnosis present

## 2024-01-02 DIAGNOSIS — Z1379 Encounter for other screening for genetic and chromosomal anomalies: Secondary | ICD-10-CM

## 2024-01-02 DIAGNOSIS — G44229 Chronic tension-type headache, not intractable: Secondary | ICD-10-CM | POA: Insufficient documentation

## 2024-01-02 DIAGNOSIS — Z124 Encounter for screening for malignant neoplasm of cervix: Secondary | ICD-10-CM | POA: Diagnosis not present

## 2024-01-02 DIAGNOSIS — R519 Headache, unspecified: Secondary | ICD-10-CM | POA: Diagnosis not present

## 2024-01-02 DIAGNOSIS — Z0283 Encounter for blood-alcohol and blood-drug test: Secondary | ICD-10-CM

## 2024-01-02 LAB — CBC
HCT: 29 % — ABNORMAL LOW (ref 36.0–46.0)
Hemoglobin: 9.8 g/dL — ABNORMAL LOW (ref 12.0–15.0)
MCH: 26.5 pg (ref 26.0–34.0)
MCHC: 33.8 g/dL (ref 30.0–36.0)
MCV: 78.4 fL — ABNORMAL LOW (ref 80.0–100.0)
Platelets: 382 K/uL (ref 150–400)
RBC: 3.7 MIL/uL — ABNORMAL LOW (ref 3.87–5.11)
RDW: 17.9 % — ABNORMAL HIGH (ref 11.5–15.5)
WBC: 10.3 K/uL (ref 4.0–10.5)
nRBC: 0 % (ref 0.0–0.2)

## 2024-01-02 LAB — BASIC METABOLIC PANEL WITH GFR
Anion gap: 14 (ref 5–15)
BUN: 7 mg/dL (ref 6–20)
CO2: 21 mmol/L — ABNORMAL LOW (ref 22–32)
Calcium: 9.1 mg/dL (ref 8.9–10.3)
Chloride: 102 mmol/L (ref 98–111)
Creatinine, Ser: 0.63 mg/dL (ref 0.44–1.00)
GFR, Estimated: >60 mL/min (ref >60)
Glucose, Bld: 90 mg/dL (ref 70–99)
Potassium: 3.8 mmol/L (ref 3.5–5.1)
Sodium: 137 mmol/L (ref 135–145)

## 2024-01-02 LAB — POC URINE PREG, ED: Preg Test, Ur: POSITIVE — AB

## 2024-01-02 LAB — TROPONIN I (HIGH SENSITIVITY): Troponin I (High Sensitivity): <2 ng/L (ref <18)

## 2024-01-02 MED ORDER — ACETAMINOPHEN 500 MG PO TABS
1000.0000 mg | ORAL_TABLET | Freq: Once | ORAL | Status: AC
  Administered 2024-01-02: 1000 mg via ORAL
  Filled 2024-01-02: qty 2

## 2024-01-02 MED ORDER — METOCLOPRAMIDE HCL 5 MG/ML IJ SOLN
5.0000 mg | Freq: Once | INTRAMUSCULAR | Status: AC
  Administered 2024-01-02: 5 mg via INTRAVENOUS
  Filled 2024-01-02: qty 2

## 2024-01-02 MED ORDER — SODIUM CHLORIDE 0.9 % IV BOLUS
500.0000 mL | Freq: Once | INTRAVENOUS | Status: AC
  Administered 2024-01-02: 500 mL via INTRAVENOUS

## 2024-01-02 NOTE — Assessment & Plan Note (Signed)
 12/11/12: Labored c-section- small narrow pelvis 10lbs 3oz. Small mid T of incision, crusted HSV lesions (unsure if primary outbreak). Op note indicates concern for CPD as well as HSV outbreak as indications for c/s.  Reviewed with patient. Discussed recommended repeat c-section given T incision. Likely timing 38-39 wks.

## 2024-01-02 NOTE — ED Provider Notes (Signed)
 [facility] Provider Note    Event Date/Time   First MD Initiated Contact with Patient 01/02/24 [phone]     (approximate)   History   Chest Pain and Headache   HPI  Shelly Hamilton is a 33 y.o. female who reports she is about [redacted] weeks pregnant who presents with complaints of a left-sided headache.  She reports it is mild to moderate but has persisted over the last several days despite taking Tylenol  so she presented to the emergency department.  She reports she also had had some intermittent chest discomfort, none at this time.  No pleurisy today.  No cough, no shortness of breath.  No calf pain or swelling.  She has no neurodeficits.  She reports she is here primarily for headache.     Physical Exam   Triage Vital Signs: ED Triage Vitals  Encounter Vitals Group     BP 01/02/24 0810 118/76     Girls Systolic BP Percentile --      Girls Diastolic BP Percentile --      Boys Systolic BP Percentile --      Boys Diastolic BP Percentile --      Pulse Rate 01/02/24 0810 74     Resp 01/02/24 0810 16     Temp 01/02/24 0810 98.2 F (36.8 C)     Temp Source 01/02/24 0810 Oral     SpO2 01/02/24 0810 100 %     Weight 01/02/24 0809 59 kg (130 lb)     Height 01/02/24 0809 1.6 m (5' 3)     Head Circumference --      Peak Flow --      Pain Score 01/02/24 0807 7     Pain Loc --      Pain Education --      Exclude from Growth Chart --     Most recent vital signs: Vitals:   01/02/24 0810 01/02/24 1035  BP: 118/76 118/74  Pulse: 74 75  Resp: 16 17  Temp: 98.2 F (36.8 C) 98.1 F (36.7 C)  SpO2: 100% 99%     General: Awake, no distress.  CV:  Good peripheral perfusion.  Resp:  Normal effort.  Abd:  No distention.  Other:  Normal neurologic exam, overall well-appearing and in no acute distress   ED Results / Procedures / Treatments   Labs (all labs ordered are listed, but only abnormal results are displayed) Labs Reviewed  BASIC METABOLIC PANEL WITH  GFR - Abnormal; Notable for the following components:      Result Value   CO2 21 (*)    All other components within normal limits  CBC - Abnormal; Notable for the following components:   RBC 3.70 (*)    Hemoglobin 9.8 (*)    HCT 29.0 (*)    MCV 78.4 (*)    RDW 17.9 (*)    All other components within normal limits  POC URINE PREG, ED - Abnormal; Notable for the following components:   Preg Test, Ur Positive (*)    All other components within normal limits  TROPONIN I (HIGH SENSITIVITY)     EKG     RADIOLOGY     PROCEDURES:  Critical Care performed:   Procedures   MEDICATIONS ORDERED IN ED: Medications  sodium chloride  0.9 % bolus 500 mL (0 mLs Intravenous Stopped 01/02/24 1034)  acetaminophen  (TYLENOL ) tablet 1,000 mg (1,000 mg Oral Given 01/02/24 0921)  metoCLOPramide (REGLAN) injection 5 mg (5 mg  Intravenous Given 01/02/24 0919)     IMPRESSION / MDM / ASSESSMENT AND PLAN / ED COURSE  I reviewed the triage vital signs and the nursing notes. Patient's presentation is most consistent with acute illness / injury with system symptoms.  Patient presents with primary complaint of headache as detailed above, she has no chest discomfort today.  EKG, enzymes reassuring, she declined x-ray which is reasonable, no concern for pneumonia  Given pregnancy will treat with fluids, Tylenol , Reglan and reevaluate.  Overall quite reassuring exam and she is well-appearing with no red flag symptoms.  Not consistent with ICH or venous sinus thrombosis  After treatment, patient reports her headache had resolved, she felt well and was ready for discharge      FINAL CLINICAL IMPRESSION(S) / ED DIAGNOSES   Final diagnoses:  Acute nonintractable headache, unspecified headache type     Rx / DC Orders   ED Discharge Orders     None        Note:  This document was prepared using Dragon voice recognition software and may include unintentional dictation errors.   Arlander Charleston, MD 01/02/24 1220

## 2024-01-02 NOTE — Assessment & Plan Note (Signed)
 Hx chronic headache AND migraine with visual aura Given regimen for prophylaxis: Vitamin B2 400mcg every day Magnesium Oxide 400-500mg  every day Co-enzyme Q10 100 mg bid Also discussed saline nasal spray, tylenol , caffeine, occasional use of sudafed if seems sinus- related.

## 2024-01-02 NOTE — Assessment & Plan Note (Signed)
 Unclear if GHTN vs prex [ ] Baseline prex labs [ ] ASA 81mg 

## 2024-01-02 NOTE — ED Triage Notes (Signed)
 Pt to ED for intermittent pain to mid chest since yesterday and L HA since 3 days. Is [redacted] weeks pregnant. Pain is dull and worse with inspiration. Denies vision changes.

## 2024-01-02 NOTE — Assessment & Plan Note (Signed)
 We did not discuss today as patient's partner was in the exam room and he is unaware of this result. Plan valtrex prophylaxis at 36 wks

## 2024-01-03 LAB — MICROSCOPIC EXAMINATION
Bacteria, UA: NONE SEEN
Casts: NONE SEEN /LPF
Epithelial Cells (non renal): >10 /HPF — AB (ref 0–10)
RBC, Urine: NONE SEEN /HPF (ref 0–2)

## 2024-01-03 LAB — URINALYSIS, ROUTINE W REFLEX MICROSCOPIC
Bilirubin, UA: NEGATIVE
Glucose, UA: NEGATIVE
Ketones, UA: NEGATIVE
Nitrite, UA: NEGATIVE
RBC, UA: NEGATIVE
Specific Gravity, UA: 1.024 (ref 1.005–1.030)
Urobilinogen, Ur: 0.2 mg/dL (ref 0.2–1.0)
pH, UA: 6 (ref 5.0–7.5)

## 2024-01-03 LAB — PROTEIN / CREATININE RATIO, URINE
Creatinine, Urine: 177.7 mg/dL
Protein, Ur: 14.2 mg/dL
Protein/Creat Ratio: 80 mg/g{creat} (ref 0–200)

## 2024-01-04 LAB — URINE CULTURE, OB REFLEX

## 2024-01-04 LAB — CULTURE, OB URINE

## 2024-01-05 ENCOUNTER — Encounter: Payer: Self-pay | Admitting: Registered Nurse

## 2024-01-05 DIAGNOSIS — O99019 Anemia complicating pregnancy, unspecified trimester: Secondary | ICD-10-CM | POA: Insufficient documentation

## 2024-01-05 DIAGNOSIS — D649 Anemia, unspecified: Secondary | ICD-10-CM | POA: Insufficient documentation

## 2024-01-05 LAB — CBC/D/PLT+RPR+RH+ABO+RUBIGG...
Antibody Screen: NEGATIVE
Basophils Absolute: 0.1 x10E3/uL (ref 0.0–0.2)
Basos: 1 %
EOS (ABSOLUTE): 0.1 x10E3/uL (ref 0.0–0.4)
Eos: 1 %
HCV Ab: NONREACTIVE
HIV Screen 4th Generation wRfx: NONREACTIVE
Hematocrit: 32 % — ABNORMAL LOW (ref 34.0–46.6)
Hemoglobin: 9.8 g/dL — ABNORMAL LOW (ref 11.1–15.9)
Hepatitis B Surface Ag: NEGATIVE
Immature Grans (Abs): 0 x10E3/uL (ref 0.0–0.1)
Immature Granulocytes: 0 %
Lymphocytes Absolute: 1.6 x10E3/uL (ref 0.7–3.1)
Lymphs: 15 %
MCH: 25.2 pg — ABNORMAL LOW (ref 26.6–33.0)
MCHC: 30.6 g/dL — ABNORMAL LOW (ref 31.5–35.7)
MCV: 82 fL (ref 79–97)
Monocytes Absolute: 0.8 x10E3/uL (ref 0.1–0.9)
Monocytes: 7 %
Neutrophils Absolute: 8.2 x10E3/uL — ABNORMAL HIGH (ref 1.4–7.0)
Neutrophils: 76 %
Platelets: 422 x10E3/uL (ref 150–450)
RBC: 3.89 x10E6/uL (ref 3.77–5.28)
RDW: 18 % — ABNORMAL HIGH (ref 11.7–15.4)
RPR Ser Ql: NONREACTIVE
Rh Factor: POSITIVE
Rubella Antibodies, IGG: 2.64 {index} (ref >0.99)
Varicella zoster IgG: NONREACTIVE
WBC: 10.7 x10E3/uL (ref 3.4–10.8)

## 2024-01-05 LAB — HCV INTERPRETATION

## 2024-01-06 LAB — MONITOR DRUG PROFILE 14(MW)
Amphetamine Scrn, Ur: NEGATIVE ng/mL
BARBITURATE SCREEN URINE: NEGATIVE ng/mL
BENZODIAZEPINE SCREEN, URINE: NEGATIVE ng/mL
Buprenorphine, Urine: NEGATIVE ng/mL
Cocaine (Metab) Scrn, Ur: NEGATIVE ng/mL
Creatinine(Crt), U: 163.6 mg/dL (ref 20.0–300.0)
Fentanyl, Urine: NEGATIVE pg/mL
Meperidine Screen, Urine: NEGATIVE ng/mL
Methadone Screen, Urine: NEGATIVE ng/mL
OXYCODONE+OXYMORPHONE UR QL SCN: NEGATIVE ng/mL
Opiate Scrn, Ur: NEGATIVE ng/mL
Ph of Urine: 5.5 (ref 4.5–8.9)
Phencyclidine Qn, Ur: NEGATIVE ng/mL
Propoxyphene Scrn, Ur: NEGATIVE ng/mL
SPECIFIC GRAVITY: 1.02
Tramadol Screen, Urine: NEGATIVE ng/mL

## 2024-01-06 LAB — CANNABINOID (GC/MS), URINE
Cannabinoid: POSITIVE — AB
Carboxy THC (GC/MS): 491 ng/mL

## 2024-01-06 LAB — NICOTINE SCREEN, URINE: Cotinine Ql Scrn, Ur: NEGATIVE ng/mL

## 2024-01-08 LAB — MATERNIT 21 PLUS CORE, BLOOD
Fetal Fraction: 17
Result (T21): NEGATIVE
Trisomy 13 (Patau syndrome): NEGATIVE
Trisomy 18 (Edwards syndrome): NEGATIVE
Trisomy 21 (Down syndrome): NEGATIVE

## 2024-01-09 LAB — CYTOLOGY - PAP
Chlamydia: NEGATIVE
Comment: NEGATIVE
Comment: NORMAL
Diagnosis: NEGATIVE
Neisseria Gonorrhea: NEGATIVE

## 2024-01-30 ENCOUNTER — Ambulatory Visit (INDEPENDENT_AMBULATORY_CARE_PROVIDER_SITE_OTHER): Admitting: Licensed Practical Nurse

## 2024-01-30 VITALS — BP 106/69 | HR 78 | Wt 134.2 lb

## 2024-01-30 DIAGNOSIS — Z369 Encounter for antenatal screening, unspecified: Secondary | ICD-10-CM

## 2024-01-30 DIAGNOSIS — Z3A17 17 weeks gestation of pregnancy: Secondary | ICD-10-CM | POA: Diagnosis not present

## 2024-01-30 DIAGNOSIS — Z1379 Encounter for other screening for genetic and chromosomal anomalies: Secondary | ICD-10-CM

## 2024-01-30 DIAGNOSIS — Z3402 Encounter for supervision of normal first pregnancy, second trimester: Secondary | ICD-10-CM

## 2024-01-30 DIAGNOSIS — Z3480 Encounter for supervision of other normal pregnancy, unspecified trimester: Secondary | ICD-10-CM

## 2024-01-30 NOTE — Progress Notes (Signed)
    Return Prenatal Note   Subjective   33 y.o. G3P1011 at [redacted]w[redacted]d presents for this follow-up prenatal visit.  Patient  Patient reports: lump on her abdomen, has bene there before her pregnancy, does not bother her- small soft bulge noted at top of abdomen mild line, nontender.   -Appetite is up and down  -mood haas been good  Movement: Absent Contractions: Not present    Objective   Flow sheet Vitals: Pulse Rate: 78 BP: 106/69 Fetal Heart Rate (bpm): 145 Total weight gain: 1 lb 3.2 oz (0.544 kg)  General Appearance  No acute distress, well appearing, and well nourished Pulmonary   Normal work of breathing Neurologic   Alert and oriented to person, place, and time Psychiatric   Mood and affect within normal limits   Assessment/Plan   Plan  33 y.o. G3P1011 at [redacted]w[redacted]d presents for follow-up OB visit. Reviewed prenatal record including previous visit note.  Supervision of other normal pregnancy, antepartum -TWG 1lbs, gained 40lbs in last pregnancy. Appetite fluctuates -Anatomy US  ordered  -AFP collected  -Discussed possible hernia, typically nothing is done unless it is causing pain or complications, we can send you to a surgeon after the baby is born  -warning signs reviewed        Orders Placed This Encounter  Procedures   US  OB Comp + 14 Wk    Standing Status:   Future    Expected Date:   02/13/2024    Expiration Date:   01/29/2025    Reason for Exam (SYMPTOM  OR DIAGNOSIS REQUIRED):   Needs anatomy u/s    Preferred Imaging Location?:   Internal             at [facility]   AFP, Serum, Open Spina Bifida    Is patient insulin dependent?:   No    Weight (lbs):   134    Gestational Age (GA), weeks:   17.5    Date on which patient was at this GA:   01/30/2024    GA Calculation Method:   LMP    Number of fetuses:   1    Donor egg?:   N    Release to patient:   Immediate [1]   No follow-ups on file.   Future Appointments  Date Time Provider Department  Center  02/11/2024  8:40 AM Gareth Mliss FALCON, FNP [facility]  02/13/2024  3:00 PM [facility] US  1 [facility] None  02/27/2024  3:35 PM Slaughterbeck, Damien, CNM [facility] None    For next visit:  continue with routine prenatal care     JINNIE HERO [facility], CNM  11/12/253:47 PM

## 2024-01-30 NOTE — Assessment & Plan Note (Addendum)
-  TWG 1lbs, gained 40lbs in last pregnancy. Appetite fluctuates -Anatomy US  ordered  -AFP collected  -Discussed possible hernia, typically nothing is done unless it is causing pain or complications, we can send you to a surgeon after the baby is born  -warning signs reviewed

## 2024-02-01 LAB — AFP, SERUM, OPEN SPINA BIFIDA
AFP MoM: 1.23
AFP Value: 58.1 ng/mL
Gest. Age on Collection Date: 17.5 wk
Maternal Age At EDD: 33 a
OSBR Risk 1 IN: 10000
Test Results:: NEGATIVE
Weight: 134 [lb_av]

## 2024-02-11 ENCOUNTER — Encounter: Payer: Self-pay | Admitting: Nurse Practitioner

## 2024-02-11 ENCOUNTER — Ambulatory Visit: Admitting: Nurse Practitioner

## 2024-02-11 VITALS — BP 112/68 | HR 87 | Temp 98.4°F | Ht 63.0 in | Wt 135.0 lb

## 2024-02-11 DIAGNOSIS — Z7689 Persons encountering health services in other specified circumstances: Secondary | ICD-10-CM

## 2024-02-11 DIAGNOSIS — D649 Anemia, unspecified: Secondary | ICD-10-CM | POA: Diagnosis not present

## 2024-02-11 DIAGNOSIS — Z3A19 19 weeks gestation of pregnancy: Secondary | ICD-10-CM

## 2024-02-11 DIAGNOSIS — K429 Umbilical hernia without obstruction or gangrene: Secondary | ICD-10-CM | POA: Diagnosis not present

## 2024-02-11 NOTE — Progress Notes (Signed)
 BP 112/68   Pulse 87   Temp 98.4 F (36.9 C)   Ht 5' 3 (1.6 m)   Wt 135 lb (61.2 kg)   LMP 09/28/2023 (Exact Date)   SpO2 99%   BMI 23.91 kg/m    Subjective:    Patient ID: Shelly Hamilton, female    DOB: [birth year], 33 y.o.   MRN: [id number]  HPI: [name] is a 33 y.o. female  Chief Complaint  Patient presents with   Establish Care    Pt currently pregnant and has concern about hernia on abdomen.    Discussed the use of AI scribe software for clinical note transcription with the patient, who gave verbal consent to proceed.  History of Present Illness Shelly Hamilton is a 33 year old female who presents to establish care and is currently [redacted] weeks pregnant.  Pregnancy status - Currently [redacted] weeks pregnant - Under the care of an OBGYN - Next OBGYN appointment scheduled for February 27, 2024 - Taking prenatal vitamins - Recent laboratory work completed - Pap smear is up to date  Abdominal wall hernia - Hernia present on the abdomen - Causes a 'weird feeling' at night, especially when attempting to sit up - Sensation existed prior to pregnancy - No associated pain or fever  Epistaxis and nasal mucosal bleeding - Blood present in nasal mucus in the mornings - Attributed to increased vascularity during pregnancy - No concern unless bleeding persists  Anemia and cold intolerance - History of low iron levels - Recent finger prick test showed low but not critically low iron - Frequent sensation of feeling cold, suspected to be related to anemia - Last CBC in October showed hemoglobin 9.8 and hematocrit 29     Depression and anxiety screenings positive, patient is anxious about umbilical hernia with pregnancy, reassurance provided.     02/11/2024    8:44 AM 11/01/2023   11:29 AM 06/24/2019    9:49 AM  Depression screen PHQ 2/9  Decreased Interest 2 0 1  Down, Depressed, Hopeless 2 0 1  PHQ - 2 Score 4 0 2  Altered sleeping 3  1  Tired, decreased energy 3  1   Change in appetite 0  2  Feeling bad or failure about yourself  2  1  Trouble concentrating 1  1  Moving slowly or fidgety/restless 0  0  Suicidal thoughts 0  0  PHQ-9 Score 13  8   Difficult doing work/chores Somewhat difficult       Data saved with a previous flowsheet row definition       02/11/2024    8:44 AM 06/24/2019    9:48 AM  GAD 7 : Generalized Anxiety Score  Nervous, Anxious, on Edge 2 1  Control/stop worrying 2 1  Worry too much - different things 1 1  Trouble relaxing 1 1  Restless 1 1  Easily annoyed or irritable 3 1  Afraid - awful might happen 0 0  Total GAD 7 Score 10 6     Relevant past medical, surgical, family and social history reviewed and updated as indicated. Interim medical history since our last visit reviewed. Allergies and medications reviewed and updated.  Review of Systems  Ten systems reviewed and is negative except as mentioned in HPI      Objective:      BP 112/68   Pulse 87   Temp 98.4 F (36.9 C)   Ht 5' 3 (1.6 m)  Wt 135 lb (61.2 kg)   LMP 09/28/2023 (Exact Date)   SpO2 99%   BMI 23.91 kg/m    Wt Readings from Last 3 Encounters:  02/11/24 135 lb (61.2 kg)  01/30/24 134 lb 3.2 oz (60.9 kg)  01/02/24 132 lb 11.2 oz (60.2 kg)    Physical Exam GENERAL: Alert, cooperative, well developed, no acute distress. HEENT: Normocephalic, normal oropharynx, moist mucous membranes. CHEST: Clear to auscultation bilaterally, no wheezes, rhonchi, or crackles. CARDIOVASCULAR: Normal heart rate and rhythm, S1 and S2 normal without murmurs. ABDOMEN: Soft, non-tender, non-distended, without organomegaly, normal bowel sounds. EXTREMITIES: No cyanosis or edema. NEUROLOGICAL: Cranial nerves grossly intact, moves all extremities without gross motor or sensory deficit.  Results for orders placed or performed in visit on 01/30/24  AFP, Serum, Open Spina Bifida   Collection Time: 01/30/24  3:25 PM  Result Value Ref Range   Results Report     Test Results: *Screen Negative*    Gest. Age on Collection Date 17.5 weeks   Gestat. Age Based On LMP    [age]D 34.0 [age]   Race Black    Weight 134 lbs   Insulin Dep Diabetes No    Multiple Gestation No    AFP Value 58.1 ng/mL   AFP MoM 1.23    OSBR Risk 1 IN 10,000    Interpretation Comment    Comment: Comment           Assessment & Plan:   Problem List Items Addressed This Visit       Other   Anemia   Other Visit Diagnoses       Pregnancy with 19 completed weeks gestation    -  Primary     Umbilical hernia without obstruction and without gangrene         Encounter to establish care            Assessment and Plan Assessment & Plan Pregnancy, [redacted] weeks gestation Currently 19 weeks weeks pregnant, established with OBGYN. Due date is April 18th, 2025, with a planned C-section on April 12th, 2025. Up to date on prenatal care and Pap smear. Taking prenatal vitamins. - Continue prenatal vitamins - Follow up with OBGYN on December 10th, 2025  Abdominal hernia in pregnancy Abdominal hernia present, not causing pain or complications. Hernia may enlarge due to pregnancy but is not currently an emergency. Risk of strangulation if fever or pain develops. - Monitor for fever or pain indicating strangulation - Avoid surgery during pregnancy unless emergency  Anemia complicating pregnancy, second trimester Mild anemia with hemoglobin at 9.8 g/dL. Reports feeling cold, possibly related to anemia. Current prenatal vitamins may not contain sufficient iron. - Discuss iron supplementation with OBGYN - Consider separate iron supplementation if recommended by OBGYN  Establish care No acute concerns beyond those related to pregnancy and hernia. - Scheduled follow-up appointment in six months        Follow up plan: Return in about 6 months (around 08/10/2024) for cpe.

## 2024-02-13 ENCOUNTER — Ambulatory Visit

## 2024-02-13 DIAGNOSIS — Z3A17 17 weeks gestation of pregnancy: Secondary | ICD-10-CM

## 2024-02-13 DIAGNOSIS — Z3402 Encounter for supervision of normal first pregnancy, second trimester: Secondary | ICD-10-CM | POA: Diagnosis not present

## 2024-02-13 DIAGNOSIS — Z369 Encounter for antenatal screening, unspecified: Secondary | ICD-10-CM

## 2024-02-13 DIAGNOSIS — Z3A19 19 weeks gestation of pregnancy: Secondary | ICD-10-CM

## 2024-02-22 ENCOUNTER — Encounter: Payer: Self-pay | Admitting: Nurse Practitioner

## 2024-02-22 ENCOUNTER — Encounter: Payer: Self-pay | Admitting: Advanced Practice Midwife

## 2024-02-27 ENCOUNTER — Ambulatory Visit: Admitting: Certified Nurse Midwife

## 2024-02-27 VITALS — BP 106/69 | HR 82 | Wt 141.7 lb

## 2024-02-27 DIAGNOSIS — D649 Anemia, unspecified: Secondary | ICD-10-CM

## 2024-02-27 DIAGNOSIS — O99352 Diseases of the nervous system complicating pregnancy, second trimester: Secondary | ICD-10-CM

## 2024-02-27 DIAGNOSIS — Z3A21 21 weeks gestation of pregnancy: Secondary | ICD-10-CM

## 2024-02-27 DIAGNOSIS — G44229 Chronic tension-type headache, not intractable: Secondary | ICD-10-CM

## 2024-02-27 DIAGNOSIS — Z3480 Encounter for supervision of other normal pregnancy, unspecified trimester: Secondary | ICD-10-CM

## 2024-02-27 DIAGNOSIS — O99012 Anemia complicating pregnancy, second trimester: Secondary | ICD-10-CM

## 2024-02-27 MED ORDER — ACCRUFER 30 MG PO CAPS: 1.0000 | ORAL_CAPSULE | Freq: Two times a day (BID) | ORAL | 6 refills | Status: AC

## 2024-02-27 NOTE — Progress Notes (Signed)
° ° °  Return Prenatal Note   Subjective   33 y.o. G3P1011 at [redacted]w[redacted]d presents for this follow-up prenatal visit.  Patient is doing well. She reports good fetal movement.  Patient reports: feeling exhausted Movement: Present Contractions: Not present  Objective   Flow sheet Vitals: Pulse Rate: 82 BP: 106/69 Fundal Height: 23 cm Fetal Heart Rate (bpm): 135 Total weight gain: 3.946 kg  General Appearance  No acute distress, well appearing, and well nourished Pulmonary   Normal work of breathing Neurologic   Alert and oriented to person, place, and time Psychiatric   Mood and affect within normal limits   Assessment/Plan   Plan  33 y.o. G3P1011 at [redacted]w[redacted]d presents for follow-up OB visit. Reviewed prenatal record including previous visit note.  Anemia - Reviewed hemoglobin levels, iron pills ordered to [facility]. Patient agrees to take morning and night. - Discussed taking iron rich foods such as red meat and leafy greens - CBC and Ferritin levels drawn today  Chronic tension type headache - Has noticed major improvement with headaches, will still have occasional headache that is relieved by adding tylenol   Supervision of other normal pregnancy, antepartum - Anticipatory guidance given regarding upcoming glucose screen, and labs.      Orders Placed This Encounter  Procedures   CBC   Ferritin   No follow-ups on file.   Future Appointments  Date Time Provider Department Center  09/10/2024  9:00 AM Gareth Mliss FALCON, FNP [facility]    For next visit:  continue with routine prenatal care     Damien Parsley, CNM [facility] [place] 12/10/253:51 PM

## 2024-02-27 NOTE — Assessment & Plan Note (Addendum)
-   Anticipatory guidance given regarding upcoming glucose screen, and labs.

## 2024-02-27 NOTE — Assessment & Plan Note (Addendum)
-   Has noticed major improvement with headaches, will still have occasional headache that is relieved by adding tylenol

## 2024-02-27 NOTE — Progress Notes (Deleted)
° ° °  Return Prenatal Note   Subjective   33 y.o. G3P1011 at [redacted]w[redacted]d presents for this follow-up prenatal visit.  Patient is doing well. She reports good fetal movement. She has no new concerns today. Patient reports: Movement: Present Contractions: Not present  Objective   Flow sheet Vitals: Pulse Rate: 82 BP: 106/69 Fundal Height: 23 cm Fetal Heart Rate (bpm): 135 Total weight gain: 8 lb 11.2 oz (3.946 kg)  General Appearance  No acute distress, well appearing, and well nourished Pulmonary   Normal work of breathing Neurologic   Alert and oriented to person, place, and time Psychiatric   Mood and affect within normal limits   Assessment/Plan   Plan  32 y.o. G3P1011 at [redacted]w[redacted]d presents for follow-up OB visit. Reviewed prenatal record including previous visit note.  Anemia - Reviewed hemoglobin levels, iron pills ordered to [facility]. Patient agrees to take morning and night. - Discussed taking iron rich foods such as red meat and leafy greens - CBC and Ferritin levels drawn today  Chronic tension type headache - Has noticed major improvement with headaches, will still have occasional headache that is relieved by adding tylenol   Supervision of other normal pregnancy, antepartum - Anticipatory guidance given regarding upcoming glucose screen, and labs.      Orders Placed This Encounter  Procedures   CBC   Ferritin   No follow-ups on file.   Future Appointments  Date Time Provider Department Center  03/26/2024  9:00 AM [facility] None  03/26/2024  9:35 AM Sebastian Sham, CNM [facility] None  09/10/2024  9:00 AM Gareth Mliss FALCON, FNP [facility]    For next visit:  continue with routine prenatal care     Damien Parsley, CNM [facility] 12/10/254:12 PM

## 2024-02-27 NOTE — Assessment & Plan Note (Addendum)
-   Reviewed hemoglobin levels, iron pills ordered to [facility]. Patient agrees to take morning and night. - Discussed taking iron rich foods such as red meat and leafy greens - CBC and Ferritin levels drawn today

## 2024-02-28 LAB — CBC
Hematocrit: 30.8 % — ABNORMAL LOW (ref 34.0–46.6)
Hemoglobin: 9.9 g/dL — ABNORMAL LOW (ref 11.1–15.9)
MCH: 28.1 pg (ref 26.6–33.0)
MCHC: 32.1 g/dL (ref 31.5–35.7)
MCV: 88 fL (ref 79–97)
Platelets: 317 x10E3/uL (ref 150–450)
RBC: 3.52 x10E6/uL — ABNORMAL LOW (ref 3.77–5.28)
RDW: 14.5 % (ref 11.7–15.4)
WBC: 9.6 x10E3/uL (ref 3.4–10.8)

## 2024-02-28 LAB — FERRITIN: Ferritin: 21 ng/mL (ref 15–150)

## 2024-03-07 NOTE — Telephone Encounter (Signed)
 Please advise does she need to go to the Hospital for SOB while resting?

## 2024-03-24 ENCOUNTER — Other Ambulatory Visit: Payer: Self-pay

## 2024-03-24 DIAGNOSIS — Z113 Encounter for screening for infections with a predominantly sexual mode of transmission: Secondary | ICD-10-CM

## 2024-03-24 DIAGNOSIS — Z131 Encounter for screening for diabetes mellitus: Secondary | ICD-10-CM

## 2024-03-24 DIAGNOSIS — Z3480 Encounter for supervision of other normal pregnancy, unspecified trimester: Secondary | ICD-10-CM

## 2024-03-24 DIAGNOSIS — Z130 Encounter for screening for diseases of the blood and blood-forming organs and certain disorders involving the immune mechanism: Secondary | ICD-10-CM

## 2024-03-24 NOTE — Progress Notes (Signed)
 28 weeks lab orders placed for 03/26/24 lab appointment.

## 2024-03-25 NOTE — Patient Instructions (Incomplete)
 Third Trimester of Pregnancy  The third trimester of pregnancy is from week 28 through week 40. This is months 7 through 9. The third trimester is a time when your baby is growing fast. Body changes during your third trimester Your body continues to change during this time. The changes usually go away after your baby is born. Physical changes You will continue to gain weight. You may get stretch marks on your hips, belly, and breasts. Your breasts will keep growing and may hurt. A yellow fluid (colostrum) may leak from your breasts. This is the first milk you're making for your baby. Your hair may grow faster and get thicker. In some cases, you may get hair loss. Your belly button may stick out. You may have more swelling in your hands, face, or ankles. Health changes You may have heartburn. You may feel short of breath. This is caused by the uterus that is now bigger. You may have more aches in the pelvis, back, or thighs. You may have more tingling or numbness in your hands, arms, and legs. You may pee more often. You may have trouble pooping (constipation) or swollen veins in the butt that can itch or get painful (hemorrhoids). Other changes You may have more problems sleeping. You may notice the baby moving lower in your belly (dropping). You may have more fluid coming from your vagina. Your joints may feel loose, and you may have pain around your pelvic bone. Follow these instructions at home: Medicines Take medicines only as told by your health care provider. Some medicines are not safe during pregnancy. Your provider may change the medicines that you take. Do not take any medicines unless told to by your provider. Take a prenatal vitamin that has at least 600 micrograms (mcg) of folic acid. Do not use herbal medicines, illegal drugs, or medicines that are not approved by your provider. Eating and drinking While you're pregnant your body needs additional nutrition to help  support your growing baby. Talk with your provider about your nutritional needs. Activity Most women are able to exercise regularly during pregnancy. Exercise routines may need to change at the end of your pregnancy. Talk to your provider about your activities and exercise routine. Relieving pain and discomfort Rest often with your legs raised if you have leg cramps or low back pain. Take warm sitz baths to soothe pain from hemorrhoids. Use hemorrhoid cream if your provider says it's okay. Wear a good, supportive bra if your breasts hurt. Do not use hot tubs, steam rooms, or saunas. Do not douche. Do not use tampons or scented pads. Safety Talk to your provider before traveling far distances. Wear your seatbelt at all times when you're in a car. Talk to your provider if someone hits you, hurts you, or yells at you. Preparing for birth To prepare for your baby: Take childbirth and breastfeeding classes. Visit the hospital and tour the maternity area. Buy a rear-facing car seat. Learn how to install it in your car. General instructions Avoid cat litter boxes and soil used by cats. These things carry germs that can cause harm to your pregnancy and your baby. Do not drink alcohol, smoke, vape, or use products with nicotine or tobacco in them. If you need help quitting, talk with your provider. Keep all follow-up visits for your third trimester. Your provider will do more exams and tests during this trimester. Write down your questions. Take them to your prenatal visits. Your provider also will: Talk with you about  your overall health. Give you advice or refer you to specialists who can help with different needs, including: Mental health and counseling. Foods and healthy eating. Ask for help if you need help with food. Where to find more information American Pregnancy Association: americanpregnancy.org [employer]ians [employer] Gynecologists: acog.org Office on [employer] Health:  Travel[employer].ca Contact a health care provider if: You have a headache that does not go away when you take medicine. You have any of these problems: You can't eat or drink. You have nausea and vomiting. You have watery poop (diarrhea) for 2 days or more. You have pain when you pee, or your pee smells bad. You have been sick for 2 days or more and aren't getting better. Contact your provider right away if: You have any of these coming from your vagina: Abnormal discharge. Bad-smelling fluid. Bleeding. Your baby is moving less than usual. You have signs of labor: You have any contractions, belly cramping, or have pain in your pelvis or lower back before 37 weeks of pregnancy (preterm labor). You have regular contractions that are less than 5 minutes apart. Your water breaks. You have symptoms of high blood pressure or preeclampsia. These include: A severe, throbbing headache that does not go away. Sudden or extreme swelling of your face, hands, legs, or feet. Vision problems: You see spots. You have blurry vision. Your eyes are sensitive to light. If you can't reach your provider, go to an urgent care or emergency room. Get help right away if: You faint, become confused, or can't think clearly. You have chest pain or trouble breathing. You have any kind of injury, such as from a fall or a car crash. These symptoms may be an emergency. Call 911 right away. Do not wait to see if the symptoms will go away. Do not drive yourself to the hospital. This information is not intended to replace advice given to you by your health care provider. Make sure you discuss any questions you have with your health care provider. Document Revised: 12/07/2022 Document Reviewed: 07/07/2022 Elsevier Patient Education  2024 [employer].

## 2024-03-26 ENCOUNTER — Ambulatory Visit: Admitting: Certified Nurse Midwife

## 2024-03-26 ENCOUNTER — Encounter: Payer: Self-pay | Admitting: Certified Nurse Midwife

## 2024-03-26 ENCOUNTER — Other Ambulatory Visit

## 2024-03-26 VITALS — BP 104/60 | HR 87 | Wt 143.9 lb

## 2024-03-26 DIAGNOSIS — Z3A25 25 weeks gestation of pregnancy: Secondary | ICD-10-CM

## 2024-03-26 DIAGNOSIS — Z131 Encounter for screening for diabetes mellitus: Secondary | ICD-10-CM

## 2024-03-26 DIAGNOSIS — Z3482 Encounter for supervision of other normal pregnancy, second trimester: Secondary | ICD-10-CM | POA: Diagnosis not present

## 2024-03-26 DIAGNOSIS — Z113 Encounter for screening for infections with a predominantly sexual mode of transmission: Secondary | ICD-10-CM

## 2024-03-26 DIAGNOSIS — Z23 Encounter for immunization: Secondary | ICD-10-CM

## 2024-03-26 DIAGNOSIS — Z3480 Encounter for supervision of other normal pregnancy, unspecified trimester: Secondary | ICD-10-CM

## 2024-03-26 DIAGNOSIS — Z130 Encounter for screening for diseases of the blood and blood-forming organs and certain disorders involving the immune mechanism: Secondary | ICD-10-CM

## 2024-03-26 NOTE — Addendum Note (Signed)
 Addended by: DELANA SAUER on: 03/26/2024 10:31 AM   Modules accepted: Orders

## 2024-03-26 NOTE — Progress Notes (Signed)
 ROB doing well, feeling good movement. Denies PTL. Has had some occasional tightening. PTL precautions reviewed. Pt have glucose screen today. She is having some sciatica on right side back /but pain. Reassurance given. Self help measures discussed.   Follow up 3 weeks for ROB.   Zelda Hummer, CNM

## 2024-03-27 LAB — 28 WEEK RH+PANEL
Basophils Absolute: 0 x10E3/uL (ref 0.0–0.2)
Basos: 0 %
EOS (ABSOLUTE): 0.1 x10E3/uL (ref 0.0–0.4)
Eos: 1 %
Gestational Diabetes Screen: 77 mg/dL (ref 70–139)
HIV Screen 4th Generation wRfx: NONREACTIVE
Hematocrit: 29.2 % — ABNORMAL LOW (ref 34.0–46.6)
Hemoglobin: 9.7 g/dL — ABNORMAL LOW (ref 11.1–15.9)
Immature Grans (Abs): 0.1 x10E3/uL (ref 0.0–0.1)
Immature Granulocytes: 1 %
Lymphocytes Absolute: 1.3 x10E3/uL (ref 0.7–3.1)
Lymphs: 12 %
MCH: 30.1 pg (ref 26.6–33.0)
MCHC: 33.2 g/dL (ref 31.5–35.7)
MCV: 91 fL (ref 79–97)
Monocytes Absolute: 0.8 x10E3/uL (ref 0.1–0.9)
Monocytes: 7 %
Neutrophils Absolute: 8.8 x10E3/uL — ABNORMAL HIGH (ref 1.4–7.0)
Neutrophils: 79 %
Platelets: 333 x10E3/uL (ref 150–450)
RBC: 3.22 x10E6/uL — ABNORMAL LOW (ref 3.77–5.28)
RDW: 13.4 % (ref 11.7–15.4)
RPR Ser Ql: NONREACTIVE
WBC: 11.2 x10E3/uL — ABNORMAL HIGH (ref 3.4–10.8)

## 2024-03-31 ENCOUNTER — Ambulatory Visit: Payer: Self-pay | Admitting: Certified Nurse Midwife

## 2024-04-08 ENCOUNTER — Other Ambulatory Visit: Payer: Self-pay

## 2024-04-08 DIAGNOSIS — Z3482 Encounter for supervision of other normal pregnancy, second trimester: Secondary | ICD-10-CM

## 2024-04-08 MED ORDER — COMPLETENATE 29-1 MG PO CHEW: 1.0000 | CHEWABLE_TABLET | Freq: Every day | ORAL | 6 refills | Status: DC

## 2024-04-08 NOTE — Progress Notes (Signed)
 Rx for prenatal vitamin sent to pharmacy per pt request.

## 2024-04-10 MED ORDER — VITAFOL FE+ 90-0.6-0.4-200 MG PO CAPS: 1.0000 | ORAL_CAPSULE | Freq: Every day | ORAL | 3 refills | Status: AC

## 2024-04-15 NOTE — Progress Notes (Unsigned)
" ° ° °  Return Prenatal Note   Subjective   34 y.o. G3P1011 at [redacted]w[redacted]d presents for this follow-up prenatal visit.  Patient mentioned pain and pressure that is brief and goes away. 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 Lightheadedness is noted, adhering to iron medication.  Patient reports: Movement: Present Contractions: Not present (patient states braxton like twice at day when moving around)  Objective   Flow sheet Vitals: Pulse Rate: (!) 104 BP: (!) 103/50 Fundal Height: 29 cm Fetal Heart Rate (bpm): 145 Total weight gain: 15 lb 11.2 oz (7.121 kg)  General Appearance  No acute distress, well appearing, and well nourished Pulmonary   Normal work of breathing Neurologic   Alert and oriented to person, place, and time Psychiatric   Mood and affect within normal limits   Assessment/Plan   Plan  34 y.o. G3P1011 at [redacted]w[redacted]d presents for follow-up OB visit. Reviewed prenatal record including previous visit note.  Supervision of other normal pregnancy, antepartum Discussed follow up in one week to talk about birth plan. Continue taking medicine for anemia. Mentioned red flags to look out for.   Anemia affecting pregnancy Recheck cbc in ~2w, has been taking accrufer  as prescribed, waiting on pharmacy to refill.  History of C-section To make appointment with physician in next 2-4w to discuss delivery planning. Does not desire BTL.      Orders Placed This Encounter  Procedures   CBC    Standing Status:   Future    Expected Date:   04/30/2024    Expiration Date:   04/16/2025   Return in 2 weeks (on 04/30/2024) for ROB.   Future Appointments  Date Time Provider Department Center  04/29/2024  8:35 AM Starla Harland BROCKS, MD [facility] None  09/10/2024  9:00 AM Gareth Mliss FALCON, FNP [facility]    For next visit:  continue with routine prenatal care     Harlene LITTIE Cisco, CNM  04/16/2609:11 AM  "

## 2024-04-16 ENCOUNTER — Encounter: Payer: Self-pay | Admitting: Certified Nurse Midwife

## 2024-04-16 ENCOUNTER — Ambulatory Visit: Admitting: Certified Nurse Midwife

## 2024-04-16 VITALS — BP 103/50 | HR 104 | Wt 148.7 lb

## 2024-04-16 DIAGNOSIS — Z3A28 28 weeks gestation of pregnancy: Secondary | ICD-10-CM

## 2024-04-16 DIAGNOSIS — O34219 Maternal care for unspecified type scar from previous cesarean delivery: Secondary | ICD-10-CM

## 2024-04-16 DIAGNOSIS — O99013 Anemia complicating pregnancy, third trimester: Secondary | ICD-10-CM

## 2024-04-16 DIAGNOSIS — Z3480 Encounter for supervision of other normal pregnancy, unspecified trimester: Secondary | ICD-10-CM

## 2024-04-16 DIAGNOSIS — Z98891 History of uterine scar from previous surgery: Secondary | ICD-10-CM

## 2024-04-16 DIAGNOSIS — D649 Anemia, unspecified: Secondary | ICD-10-CM | POA: Diagnosis not present

## 2024-04-16 NOTE — Assessment & Plan Note (Signed)
 Discussed follow up in one week to talk about birth plan. Continue taking medicine for anemia. Mentioned red flags to look out for.

## 2024-04-16 NOTE — Assessment & Plan Note (Signed)
 Recheck cbc in ~2w, has been taking accrufer  as prescribed, waiting on pharmacy to refill.

## 2024-04-16 NOTE — Assessment & Plan Note (Signed)
 To make appointment with physician in next 2-4w to discuss delivery planning. Does not desire BTL.

## 2024-04-16 NOTE — Patient Instructions (Signed)
 Third Trimester of Pregnancy  The third trimester of pregnancy is from week 28 through week 40. This is months 7 through 9. The third trimester is a time when your baby is growing fast. Body changes during your third trimester Your body continues to change during this time. The changes usually go away after your baby is born. Physical changes You will continue to gain weight. You may get stretch marks on your hips, belly, and breasts. Your breasts will keep growing and may hurt. A yellow fluid (colostrum) may leak from your breasts. This is the first milk you're making for your baby. Your hair may grow faster and get thicker. In some cases, you may get hair loss. Your belly button may stick out. You may have more swelling in your hands, face, or ankles. Health changes You may have heartburn. You may feel short of breath. This is caused by the uterus that is now bigger. You may have more aches in the pelvis, back, or thighs. You may have more tingling or numbness in your hands, arms, and legs. You may pee more often. You may have trouble pooping (constipation) or swollen veins in the butt that can itch or get painful (hemorrhoids). Other changes You may have more problems sleeping. You may notice the baby moving lower in your belly (dropping). You may have more fluid coming from your vagina. Your joints may feel loose, and you may have pain around your pelvic bone. Follow these instructions at home: Medicines Take medicines only as told by your health care provider. Some medicines are not safe during pregnancy. Your provider may change the medicines that you take. Do not take any medicines unless told to by your provider. Take a prenatal vitamin that has at least 600 micrograms (mcg) of folic acid. Do not use herbal medicines, illegal drugs, or medicines that are not approved by your provider. Eating and drinking While you're pregnant your body needs additional nutrition to help  support your growing baby. Talk with your provider about your nutritional needs. Activity Most women are able to exercise regularly during pregnancy. Exercise routines may need to change at the end of your pregnancy. Talk to your provider about your activities and exercise routine. Relieving pain and discomfort Rest often with your legs raised if you have leg cramps or low back pain. Take warm sitz baths to soothe pain from hemorrhoids. Use hemorrhoid cream if your provider says it's okay. Wear a good, supportive bra if your breasts hurt. Do not use hot tubs, steam rooms, or saunas. Do not douche. Do not use tampons or scented pads. Safety Talk to your provider before traveling far distances. Wear your seatbelt at all times when you're in a car. Talk to your provider if someone hits you, hurts you, or yells at you. Preparing for birth To prepare for your baby: Take childbirth and breastfeeding classes. Visit the hospital and tour the maternity area. Buy a rear-facing car seat. Learn how to install it in your car. General instructions Avoid cat litter boxes and soil used by cats. These things carry germs that can cause harm to your pregnancy and your baby. Do not drink alcohol, smoke, vape, or use products with nicotine or tobacco in them. If you need help quitting, talk with your provider. Keep all follow-up visits for your third trimester. Your provider will do more exams and tests during this trimester. Write down your questions. Take them to your prenatal visits. Your provider also will: Talk with you about  your overall health. Give you advice or refer you to specialists who can help with different needs, including: Mental health and counseling. Foods and healthy eating. Ask for help if you need help with food. Where to find more information American Pregnancy Association: americanpregnancy.org [employer]ians and Gynecologists: acog.org Office on [employer] Health:  [employer]ravellesson.ca Contact a health care provider if: You have a headache that does not go away when you take medicine. You have any of these problems: You can't eat or drink. You have nausea and vomiting. You have watery poop (diarrhea) for 2 days or more. You have pain when you pee, or your pee smells bad. You have been sick for 2 days or more and aren't getting better. Contact your provider right away if: You have any of these coming from your vagina: Abnormal discharge. Bad-smelling fluid. Bleeding. Your baby is moving less than usual. You have signs of labor: You have any contractions, belly cramping, or have pain in your pelvis or lower back before 37 weeks of pregnancy (preterm labor). You have regular contractions that are less than 5 minutes apart. Your water breaks. You have symptoms of high blood pressure or preeclampsia. These include: A severe, throbbing headache that does not go away. Sudden or extreme swelling of your face, hands, legs, or feet. Vision problems: You see spots. You have blurry vision. Your eyes are sensitive to light. If you can't reach your provider, go to an urgent care or emergency room. Get help right away if: You faint, become confused, or can't think clearly. You have chest pain or trouble breathing. You have any kind of injury, such as from a fall or a car crash. These symptoms may be an emergency. Call 911 right away. Do not wait to see if the symptoms will go away. Do not drive yourself to the hospital. This information is not intended to replace advice given to you by your health care provider. Make sure you discuss any questions you have with your health care provider. Document Revised: 12/07/2022 Document Reviewed: 07/07/2022 Elsevier Patient Education  2024 Elsevier Inc[employer] Problems to Watch for During Pregnancy During pregnancy, your body goes through many changes. Some changes may be uncomfortable. But most changes are not a serious  problem. It's important to learn when certain signs and symptoms may be a problem. Talk with your health care provider about any medical conditions you have. Make sure you know the symptoms to watch for. Reporting problems early will prevent complications. Problems to watch for during pregnancy You're more likely to get an infection during pregnancy. Let your provider know if you have signs of infection, such as: A fever. A bad-smelling fluid from your vagina. Peeing too often, wanting to pee urgently, or pain when you pee. Also, let your provider know if: You're very tired, you feel dizzy, or you faint. You have watery poop (diarrhea) for 24 hours or longer. You throw up or feel like throwing up for 24 hours or longer. You have cramping in your belly or have pain in your hips or lower back. You have spotting, bleeding, or leaking of fluid from your vagina. You have pain, swelling, or redness in an arm or leg. You should also watch for signs of high blood pressure and preeclampsia. These signs can be very serious. They include: A headache that doesn't go away when you take medicine. Sudden or very bad swelling of your face, hands, legs, or feet. Problems seeing, such as: You see spots. You have  blurry vision. You may be sensitive to light. Why it's important to watch for these problems Watching and reporting problems to your provider can help prevent complications that may affect you and your baby. These include: Higher risk of giving birth early. Infection that may be passed on to your baby. Higher risk for stillbirth. Follow these instructions at home:  Take your medicines only as told. Keep all follow-up visits. Your provider needs to monitor your health and your baby's health. Where to find more information To learn more, go to these websites: Centers for Disease Control and Prevention (CDC) at tonerpromos.no. Then: Click Health Topics A-Z. Type urgent maternal warning signs in the  search box. [employer]ologists (ACOG): acog.org Contact a health care provider if: You have any problems while you're pregnant. You feel your baby moving less than usual. You have any of these things: You have strong emotions, such as sadness or anxiety, that affect your daily life. You do not feel safe in your home. You use tobacco, alcohol, or drugs, and you need help to stop. Get help right away if: You faint, have a seizure, or cannot think clearly. You have chest pain or difficulty breathing. You have any of the following symptoms and you were unable to reach your provider: You have symptoms of infection, including a fever, or have vaginal bleeding. You have symptoms of high blood pressure or preeclampsia. You have signs or symptoms of labor before 37 weeks of pregnancy. These include: Contractions that are 5 minutes or less apart, or that increase in frequency, intensity, or length. Sudden, sharp pain in the belly, or low back pain. Any amount of fluid that flows from your vagina without stopping. These symptoms may be an emergency. Call 911 right away. Do not wait to see if the symptoms will go away. Do not drive yourself to the hospital. This information is not intended to replace advice given to you by your health care provider. Make sure you discuss any questions you have with your health care provider. Document Revised: 08/15/2022 Document Reviewed: 08/15/2022 Elsevier Patient Education  2024 [employer].

## 2024-04-29 ENCOUNTER — Encounter: Admitting: Obstetrics & Gynecology

## 2024-07-04 ENCOUNTER — Inpatient Hospital Stay: Admit: 2024-07-04

## 2024-09-10 ENCOUNTER — Encounter: Admitting: Nurse Practitioner
